# Patient Record
Sex: Female | Born: 1985 | Race: White | Hispanic: No | Marital: Single | Smoking: Current every day smoker
Health system: Southern US, Community
[De-identification: ages and names within clinical notes are randomized; demographics above are authoritative.]

## PROBLEM LIST (undated history)

## (undated) DIAGNOSIS — IMO0002 Reserved for concepts with insufficient information to code with codable children: Secondary | ICD-10-CM

## (undated) DIAGNOSIS — F1920 Other psychoactive substance dependence, uncomplicated: Secondary | ICD-10-CM

## (undated) DIAGNOSIS — K219 Gastro-esophageal reflux disease without esophagitis: Secondary | ICD-10-CM

## (undated) DIAGNOSIS — Z8489 Family history of other specified conditions: Secondary | ICD-10-CM

## (undated) DIAGNOSIS — R300 Dysuria: Secondary | ICD-10-CM

## (undated) DIAGNOSIS — F410 Panic disorder [episodic paroxysmal anxiety] without agoraphobia: Secondary | ICD-10-CM

## (undated) DIAGNOSIS — Z889 Allergy status to unspecified drugs, medicaments and biological substances status: Secondary | ICD-10-CM

## (undated) DIAGNOSIS — F32A Depression, unspecified: Secondary | ICD-10-CM

## (undated) DIAGNOSIS — N3000 Acute cystitis without hematuria: Secondary | ICD-10-CM

## (undated) DIAGNOSIS — S0291XA Unspecified fracture of skull, initial encounter for closed fracture: Secondary | ICD-10-CM

## (undated) DIAGNOSIS — T753XXA Motion sickness, initial encounter: Secondary | ICD-10-CM

## (undated) DIAGNOSIS — F329 Major depressive disorder, single episode, unspecified: Secondary | ICD-10-CM

## (undated) HISTORY — DX: Panic disorder (episodic paroxysmal anxiety): F41.0

## (undated) HISTORY — DX: Acute cystitis without hematuria: N30.00

## (undated) HISTORY — DX: Other psychoactive substance dependence, uncomplicated: F19.20

## (undated) HISTORY — DX: Dysuria: R30.0

## (undated) HISTORY — DX: Depression, unspecified: F32.A

## (undated) HISTORY — DX: Reserved for concepts with insufficient information to code with codable children: IMO0002

## (undated) HISTORY — DX: Unspecified fracture of skull, initial encounter for closed fracture: S02.91XA

## (undated) HISTORY — DX: Major depressive disorder, single episode, unspecified: F32.9

---
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
## 2012-02-11 ENCOUNTER — Encounter: Payer: Self-pay | Admitting: Internal Medicine

## 2012-02-17 ENCOUNTER — Encounter: Payer: Self-pay | Admitting: Internal Medicine

## 2012-02-19 ENCOUNTER — Other Ambulatory Visit: Payer: Self-pay | Admitting: Internal Medicine

## 2012-02-19 ENCOUNTER — Other Ambulatory Visit (INDEPENDENT_AMBULATORY_CARE_PROVIDER_SITE_OTHER): Payer: Self-pay

## 2012-02-19 ENCOUNTER — Encounter: Payer: Self-pay | Admitting: Internal Medicine

## 2012-02-19 ENCOUNTER — Ambulatory Visit (INDEPENDENT_AMBULATORY_CARE_PROVIDER_SITE_OTHER): Payer: Self-pay | Admitting: Internal Medicine

## 2012-02-19 VITALS — BP 92/70 | HR 64 | Ht 64.0 in | Wt 89.0 lb

## 2012-02-19 DIAGNOSIS — R110 Nausea: Secondary | ICD-10-CM

## 2012-02-19 DIAGNOSIS — R1030 Lower abdominal pain, unspecified: Secondary | ICD-10-CM

## 2012-02-19 DIAGNOSIS — R140 Abdominal distension (gaseous): Secondary | ICD-10-CM

## 2012-02-19 DIAGNOSIS — R932 Abnormal findings on diagnostic imaging of liver and biliary tract: Secondary | ICD-10-CM

## 2012-02-19 DIAGNOSIS — R142 Eructation: Secondary | ICD-10-CM

## 2012-02-19 DIAGNOSIS — R197 Diarrhea, unspecified: Secondary | ICD-10-CM

## 2012-02-19 DIAGNOSIS — R109 Unspecified abdominal pain: Secondary | ICD-10-CM

## 2012-02-19 DIAGNOSIS — R141 Gas pain: Secondary | ICD-10-CM

## 2012-02-19 LAB — HEPATIC FUNCTION PANEL
ALT: 15 U/L (ref 0–35)
AST: 21 U/L (ref 0–37)
Bilirubin, Direct: 0.1 mg/dL (ref 0.0–0.3)
Total Bilirubin: 0.4 mg/dL (ref 0.3–1.2)
Total Protein: 7.2 g/dL (ref 6.0–8.3)

## 2012-02-19 MED ORDER — HYOSCYAMINE SULFATE 0.125 MG SL SUBL: 0.1250 mg | SUBLINGUAL_TABLET | SUBLINGUAL | Status: DC | PRN

## 2012-02-19 MED ORDER — ALIGN PO CAPS: 1.0000 | ORAL_CAPSULE | Freq: Every day | ORAL | Status: DC

## 2012-02-19 MED ORDER — OMEPRAZOLE 40 MG PO CPDR: 40.0000 mg | DELAYED_RELEASE_CAPSULE | Freq: Every day | ORAL | Status: DC

## 2012-02-19 NOTE — Patient Instructions (Addendum)
Your physician has requested that you go to the basement for lab work before leaving today.  We have sent the following medications to your pharmacy for you to pick up at your convenience:Levsin; take as directed Omeprazole; take 30 min before your first meal of the day. Align daily  You have been scheduled for an Abdominal U/S at [facility] on 02/24/2012 @ 8:30am please arrive at 8:15am to Radiology (1st Floor) Have nothing to eat or Drink After midnight prior to your Scan.  If you cannot make this appointment please call [phone] to reschedule  Follow up With Dr. Rhea Belton in 6 weeks

## 2012-02-19 NOTE — Progress Notes (Signed)
02/19/2012 Meredith Perez [id number] 05-Nov-1985   HISTORY OF PRESENT ILLNESS:  Patient presents to our office today with several complaints including nausea, lower abdominal pain, bloating, and diarrhea.  She says that the nausea has been present for years, but has been getting worse recently.  She also has heartburn/indigestion on and off as well.  Has phenergan, which she takes as needed with some relief.  Has taken omeprazole for short courses in the past as well, which has seemed to help also.    The abdominal pain has been present for a couple of months and it was initially thought to be due to a UTI or GYN related.  It is described as a sharp pain at times, but less severe constantly.  Feels bloated and thinks it is causing pressure into her back with low back pain.  A CT of the abdomen and pelvis was performed when she went to the ED because of the pain.  This showed some periportal hepatic edema and a left ovarian corpus luteum cyst, but was otherwise unremarkable.  LFT's ,CBC, and BMP were all WNL's.  Says that she does have burning and pain when she urinates.  Diarrhea developed with the last month and a half.  Goes 4-5 times a day without blood.  Was black but she was taking pepto-bismol several times a day.  Wakes up sometimes once a night with need to have a BM.  Abdominal pain does not get better after BM.  No recent travel, sick contacts, etc.  Stools studies were not performed, but she was placed on flagyl empirically.  Says that she has lost some weight, maybe 5 pounds or so.   Not sure how good her diet is; she seems to drink a lot of coffee and soda.    Past Medical History  Diagnosis Date  . Depression   . HGSIL (high grade squamous intraepithelial dysplasia)   . Panic disorder   . Skull fracture   . Substance dependence   . Acute cystitis   . Dysuria    History reviewed. No pertinent past surgical history.  reports that she has been smoking Cigarettes.  She has been  smoking about .5 packs per day. She has never used smokeless tobacco. She reports that she does not drink alcohol or use illicit drugs. family history includes Breast cancer in her paternal grandmother; Colon cancer in an unspecified family member; and Heart disease in her maternal grandfather. Allergies  Allergen Reactions  . Sulfa Antibiotics Rash      Outpatient Encounter Prescriptions as of 02/19/2012  Medication Sig Dispense Refill  . metroNIDAZOLE (FLAGYL) 500 MG tablet Take 500 mg by mouth 3 (three) times daily.      . bifidobacterium infantis (ALIGN) capsule Take 1 capsule by mouth daily.  14 capsule  0  . hyoscyamine (LEVSIN SL) 0.125 MG SL tablet Place 1 tablet (0.125 mg total) under the tongue every 4 (four) hours as needed for cramping.  30 tablet  0  . omeprazole (PRILOSEC) 40 MG capsule Take 1 capsule (40 mg total) by mouth daily.  90 capsule  3  . [DISCONTINUED] imiquimod (ALDARA) 5 % cream Apply topically 3 (three) times a week.         REVIEW OF SYSTEMS  : All other systems reviewed and negative except where noted in the History of Present Illness.   PHYSICAL EXAM: BP 92/70  Pulse 64  Ht 5\' 4"  (1.626 m)  Wt 89 lb (40.37 kg)  BMI 15.28 kg/m2  LMP 02/10/2012 General: Thin white female in no acute distress Head: Normocephalic and atraumatic Eyes:  sclerae anicteric,conjunctive pink. Ears: Normal auditory acuity. Lungs: Clear throughout to auscultation Heart: Regular rate and rhythm Abdomen: Soft, non-distended. No masses or hepatomegaly noted. Normal bowel sounds.  Mild diffuse TTP without R/R/G. Musculoskeletal: Symmetrical with no gross deformities  Skin: No lesions on visible extremities Extremities: No edema  Neurological: Alert oriented x 4, grossly nonfocal Psychological:  Alert and cooperative. Normal mood and affect  ASSESSMENT AND PLAN: -CT of the abdomen showing periportal hepatic edema:  Had normal LFT's.  Likely secondary to IVF's that she was given  just prior to the CT scan.  Will repeat LFT's and check ultrasound of the liver and gallbladder. -Nausea and heartburn/indigestion:  Will start omeprazole 40 mg daily.  Continue phenergan as needed. 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  Will start daily probiotic, Align samples given.  Check TSH and celiac panel.  Check stool studies as well:  Cdiff, stool culture, WBC's, and O&P. -Lower abdominal pain and bloating:  Likely related to the diarrhea.  Will try levsin.  Check a urinalysis with culture as well since she is still complaining of urinary symptoms.  Low gas diet as well.  *Patient will follow-up in 6 weeks.  If still having diarrhea and no improvement, then would consider flex sig.  If nausea not improved, then possible EGD.  Addendum: The above note was completed by Doug Sou, PA-C.  I participated in the patient evaluation, formulation of assessment and plan. Reviewed and agree with initial management. Beverley Fiedler, MD

## 2012-02-20 LAB — FECAL LACTOFERRIN, QUANT: Lactoferrin: NEGATIVE

## 2012-02-23 LAB — OVA AND PARASITE EXAMINATION

## 2012-02-24 ENCOUNTER — Ambulatory Visit (HOSPITAL_COMMUNITY)
Admission: RE | Admit: 2012-02-24 | Discharge: 2012-02-24 | Disposition: A | Discharge status: Home / Self Care | Payer: Medicaid Other | Source: Ambulatory Visit | Attending: Internal Medicine | Admitting: Internal Medicine

## 2012-02-24 DIAGNOSIS — R140 Abdominal distension (gaseous): Secondary | ICD-10-CM

## 2012-02-24 DIAGNOSIS — R1030 Lower abdominal pain, unspecified: Secondary | ICD-10-CM

## 2012-02-24 DIAGNOSIS — R110 Nausea: Secondary | ICD-10-CM

## 2012-02-24 DIAGNOSIS — R109 Unspecified abdominal pain: Secondary | ICD-10-CM | POA: Insufficient documentation

## 2012-02-25 ENCOUNTER — Ambulatory Visit: Payer: Self-pay | Admitting: Gastroenterology

## 2012-02-26 ENCOUNTER — Other Ambulatory Visit: Payer: Self-pay | Admitting: *Deleted

## 2012-02-26 DIAGNOSIS — R300 Dysuria: Secondary | ICD-10-CM

## 2013-01-12 ENCOUNTER — Other Ambulatory Visit: Payer: Self-pay

## 2014-03-10 IMAGING — US US ABDOMEN COMPLETE
1 series · 14 of 25 positions shown · non-contrast
Comparison: None.

CLINICAL DATA: Abdominal pain

COMPLETE ABDOMINAL ULTRASOUND

[Series 1: us abdomen complete · 0.20mm/px · 14 of 79 slices shown]
[im 1/79]
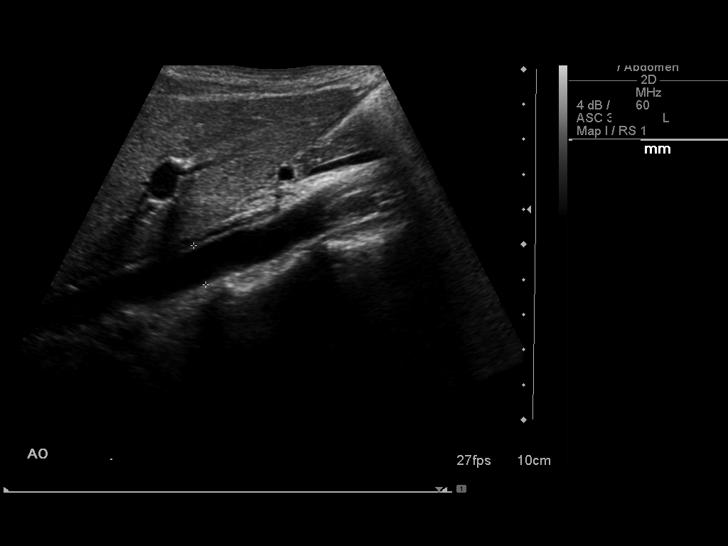
[im 7/79]
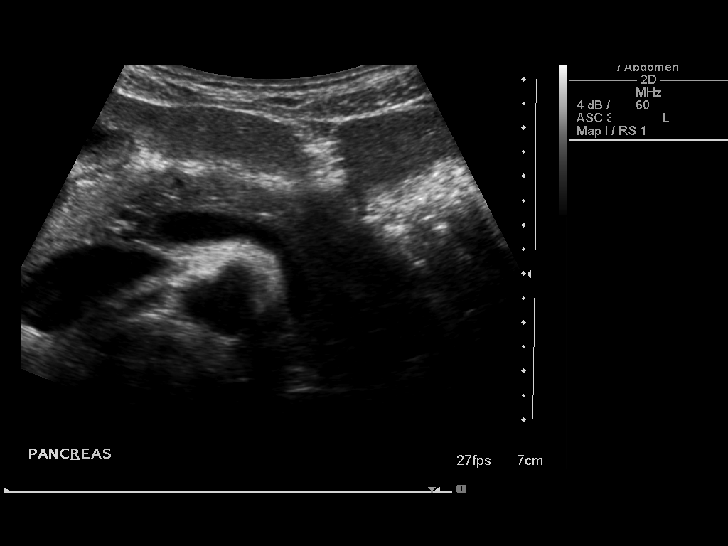
[im 14/79]
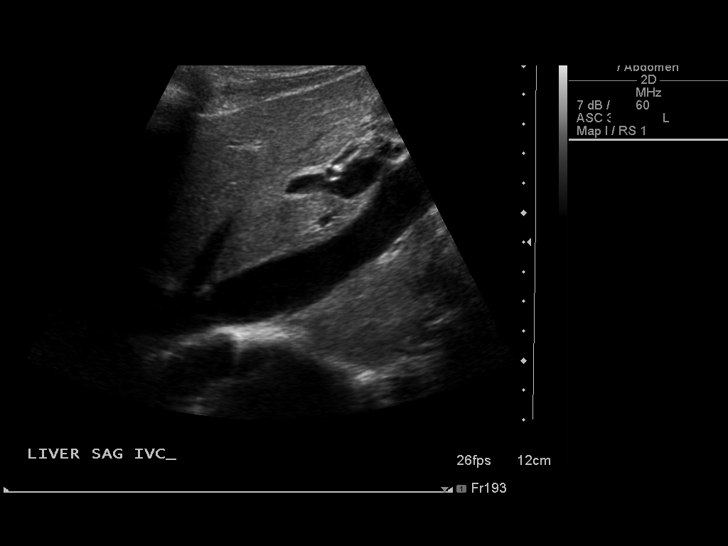
[im 20/79]
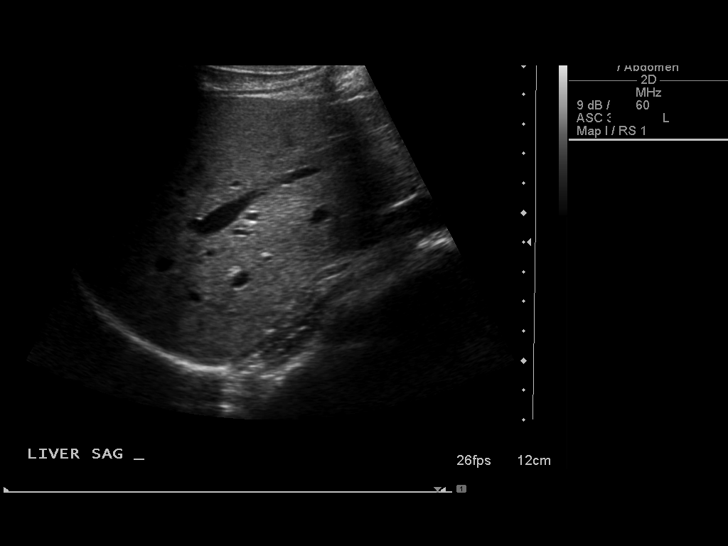
[im 27/79]
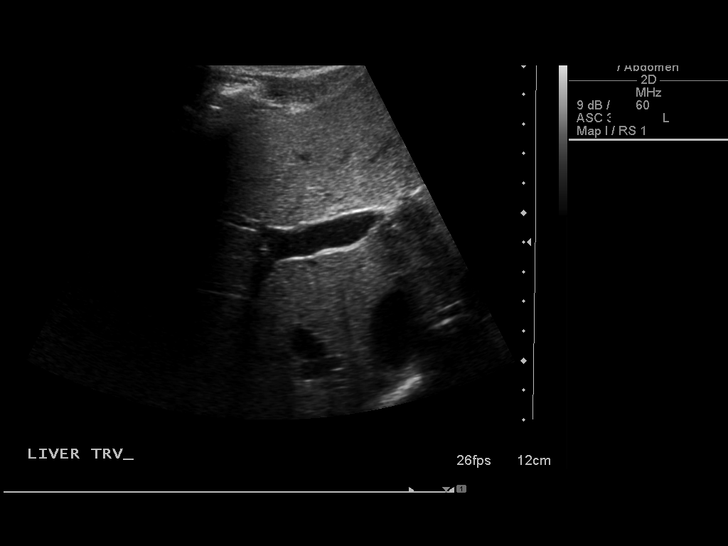
[im 30/79]
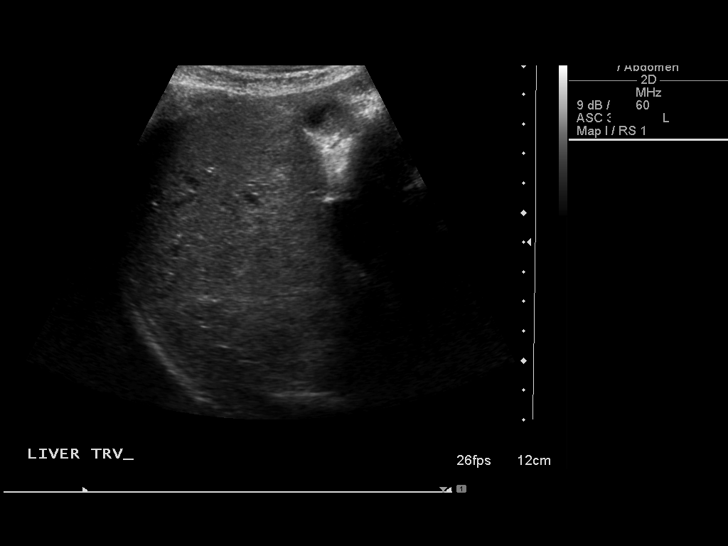
[im 36/79]
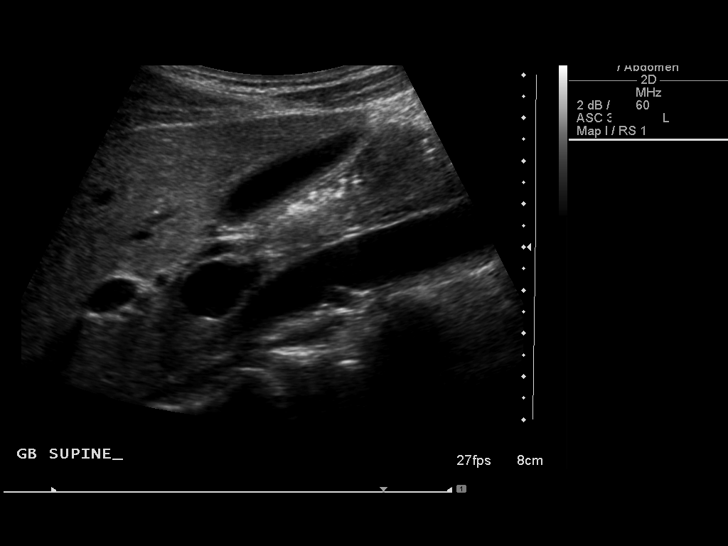
[im 43/79]
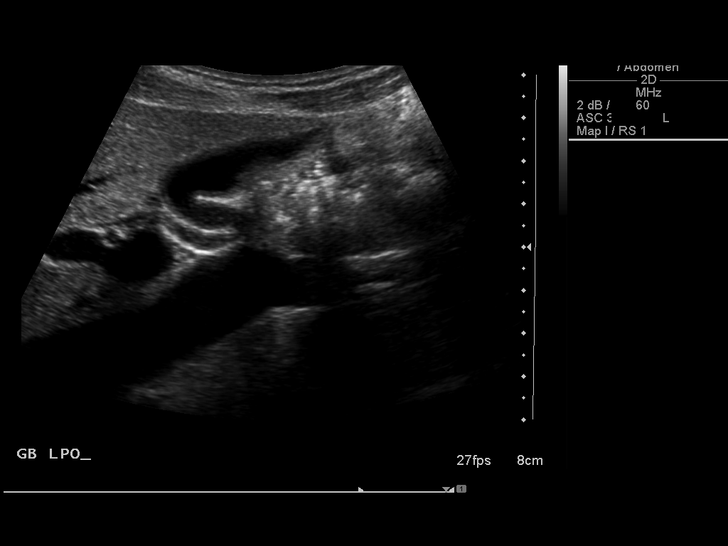
[im 49/79]
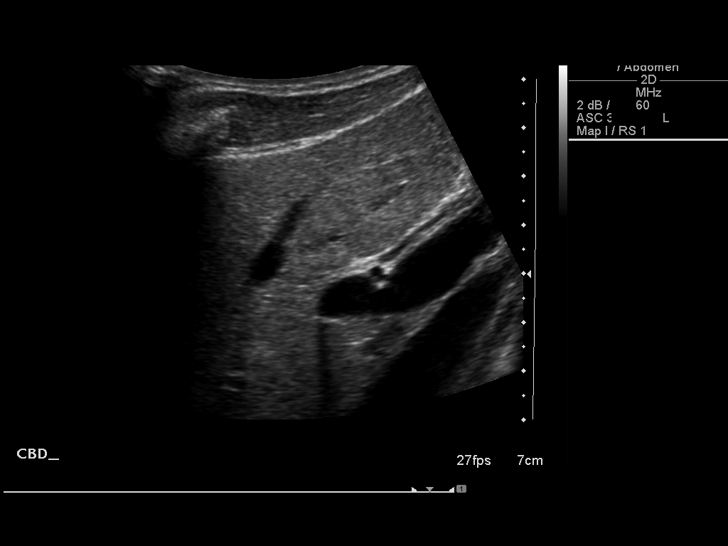
[im 53/79]
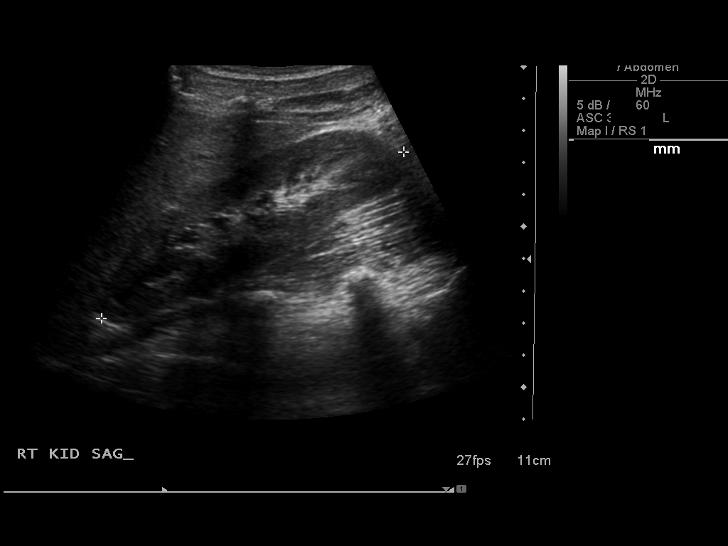
[im 59/79]
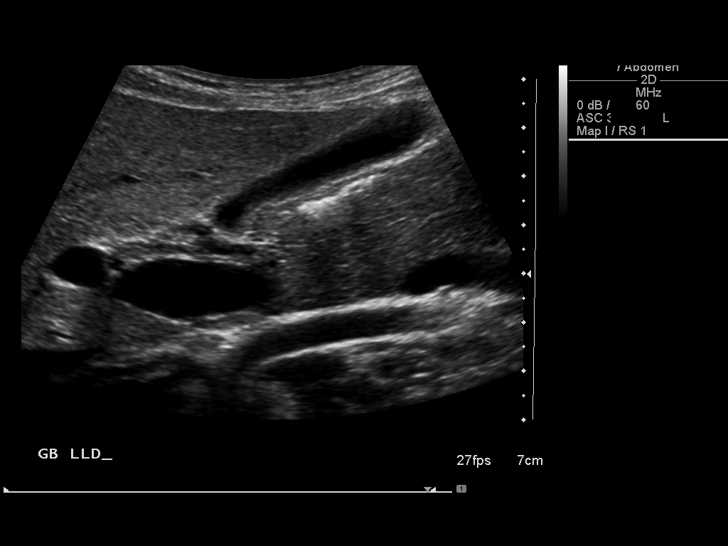
[im 66/79]
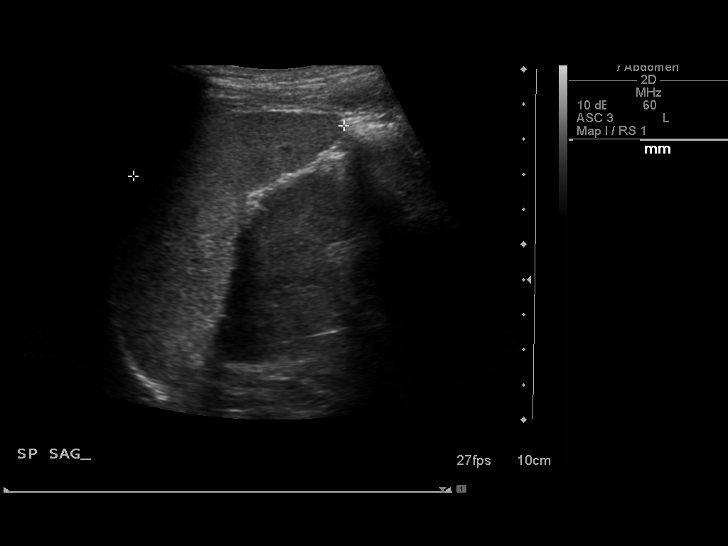
[im 72/79]
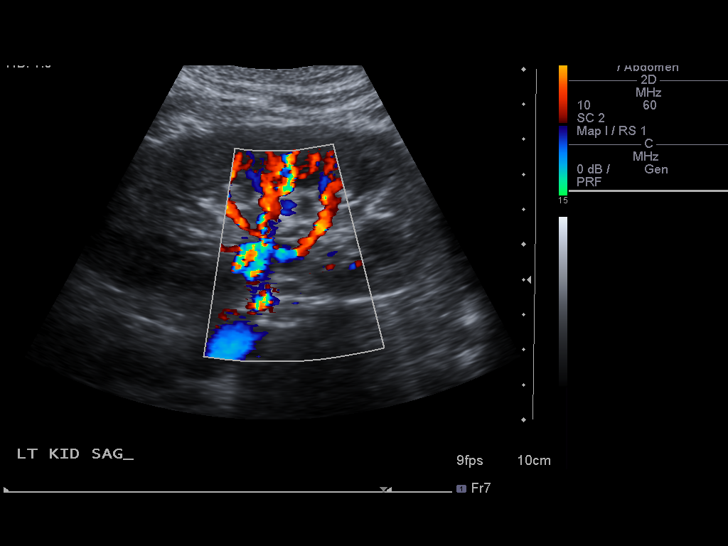
[im 79/79]
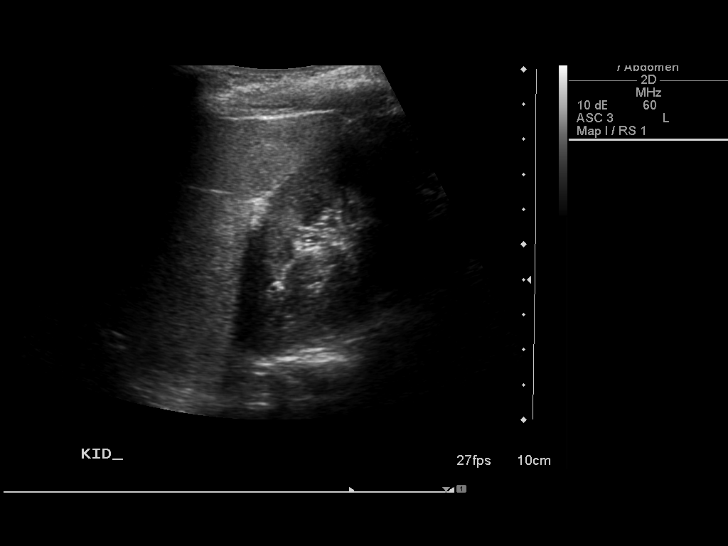

[14 of 25 positions shown; findings below may reference images not displayed]

FINDINGS: Gallbladder:  No gallstones, gallbladder wall thickening, or
pericholecystic fluid. No sonographic Murphy's sign.

Common bile duct:  Measures 3.2 mm in diameter within normal
limits.

Liver:  No focal lesion identified.  Within normal limits in
parenchymal echogenicity.

IVC:  Appears normal.

Pancreas:  No focal abnormality seen.

Spleen:  Measures 6.2 cm in length.  Normal echogenicity.

Right Kidney:  Measures 10.8 cm in length.  No mass, hydronephrosis
or diagnostic renal calculus

Left Kidney:  Measures 10.6 cm in length.  No mass, hydronephrosis
or diagnostic renal calculus

Abdominal aorta:  No aneurysm identified. Measures up to 1.2 cm in
diameter.
IMPRESSION: Negative abdominal ultrasound.

## 2015-07-01 ENCOUNTER — Encounter: Payer: Self-pay | Admitting: Gynecologic Oncology

## 2015-07-01 ENCOUNTER — Ambulatory Visit: Payer: Medicaid Other | Attending: Gynecologic Oncology | Admitting: Gynecologic Oncology

## 2015-07-01 VITALS — BP 113/53 | HR 73 | Temp 97.6°F | Resp 18 | Ht 64.0 in | Wt 83.7 lb

## 2015-07-01 DIAGNOSIS — R971 Elevated cancer antigen 125 [CA 125]: Secondary | ICD-10-CM | POA: Diagnosis not present

## 2015-07-01 DIAGNOSIS — F17210 Nicotine dependence, cigarettes, uncomplicated: Secondary | ICD-10-CM | POA: Diagnosis not present

## 2015-07-01 DIAGNOSIS — F1310 Sedative, hypnotic or anxiolytic abuse, uncomplicated: Secondary | ICD-10-CM | POA: Diagnosis not present

## 2015-07-01 DIAGNOSIS — N83201 Unspecified ovarian cyst, right side: Secondary | ICD-10-CM | POA: Insufficient documentation

## 2015-07-01 DIAGNOSIS — F419 Anxiety disorder, unspecified: Secondary | ICD-10-CM

## 2015-07-01 DIAGNOSIS — E43 Unspecified severe protein-calorie malnutrition: Secondary | ICD-10-CM | POA: Diagnosis not present

## 2015-07-01 DIAGNOSIS — R110 Nausea: Secondary | ICD-10-CM | POA: Insufficient documentation

## 2015-07-01 DIAGNOSIS — F329 Major depressive disorder, single episode, unspecified: Secondary | ICD-10-CM

## 2015-07-01 DIAGNOSIS — N83291 Other ovarian cyst, right side: Secondary | ICD-10-CM | POA: Diagnosis not present

## 2015-07-01 DIAGNOSIS — R300 Dysuria: Secondary | ICD-10-CM | POA: Diagnosis not present

## 2015-07-01 DIAGNOSIS — R197 Diarrhea, unspecified: Secondary | ICD-10-CM | POA: Diagnosis not present

## 2015-07-01 DIAGNOSIS — F418 Other specified anxiety disorders: Secondary | ICD-10-CM

## 2015-07-01 DIAGNOSIS — E46 Unspecified protein-calorie malnutrition: Secondary | ICD-10-CM | POA: Diagnosis not present

## 2015-07-01 DIAGNOSIS — N83299 Other ovarian cyst, unspecified side: Secondary | ICD-10-CM

## 2015-07-01 DIAGNOSIS — R102 Pelvic and perineal pain: Secondary | ICD-10-CM | POA: Insufficient documentation

## 2015-07-01 DIAGNOSIS — R112 Nausea with vomiting, unspecified: Secondary | ICD-10-CM | POA: Insufficient documentation

## 2015-07-01 DIAGNOSIS — G8929 Other chronic pain: Secondary | ICD-10-CM | POA: Diagnosis not present

## 2015-07-01 DIAGNOSIS — M25559 Pain in unspecified hip: Secondary | ICD-10-CM | POA: Insufficient documentation

## 2015-07-01 DIAGNOSIS — N3000 Acute cystitis without hematuria: Secondary | ICD-10-CM | POA: Diagnosis not present

## 2015-07-01 DIAGNOSIS — F1121 Opioid dependence, in remission: Secondary | ICD-10-CM | POA: Insufficient documentation

## 2015-07-01 MED ORDER — LORAZEPAM 0.5 MG PO TABS: 0.5000 mg | ORAL_TABLET | Freq: Every day | ORAL | Status: DC

## 2015-07-01 NOTE — Patient Instructions (Signed)
Preparing for your Surgery  Plan for surgery on May 9 with Dr. Laurette Schimke at [facility].  You will be scheduled for a robotic assisted right salpingo-oophorectomy, possible hysterectomy, possible bilateral salpingo-oophorectomy, possible staging.  Pre-operative Testing -You will receive a phone call from presurgical testing at [facility] to arrange for a pre-operative testing appointment before your surgery.  This appointment normally occurs one to two weeks before your scheduled surgery.   -Bring your insurance card, copy of an advanced directive if applicable, medication list  -At that visit, you will be asked to sign a consent for a possible blood transfusion in case a transfusion becomes necessary during surgery.  The need for a blood transfusion is rare but having consent is a necessary part of your care.     -You should not be taking blood thinners or aspirin at least ten days prior to surgery unless instructed by your surgeon.  Day Before Surgery at Home -You will be asked to take in a light diet the day before surgery.  Avoid carbonated beverages.  You will be advised to have nothing to eat or drink after midnight the evening before.     Eat a light diet the day before surgery.  Examples including soups, broths, toast, yogurt, mashed potatoes.  Things to avoid include carbonated beverages (fizzy beverages), raw fruits and raw vegetables, or beans.    If your bowels are filled with gas, your surgeon will have difficulty visualizing your pelvic organs which increases your surgical risks.  Your role in recovery Your role is to become active as soon as directed by your doctor, while still giving yourself time to heal.  Rest when you feel tired. You will be asked to do the following in order to speed your recovery:  - Cough and breathe deeply. This helps toclear and expand your lungs and can prevent pneumonia. You may be given a spirometer to practice  deep breathing. A staff member will show you how to use the spirometer. - Do mild physical activity. Walking or moving your legs help your circulation and body functions return to normal. A staff member will help you when you try to walk and will provide you with simple exercises. Do not try to get up or walk alone the first time. - Actively manage your pain. Managing your pain lets you move in comfort. We will ask you to rate your pain on a scale of zero to 10. It is your responsibility to tell your doctor or nurse where and how much you hurt so your pain can be treated.  Special Considerations -If you are diabetic, you may be placed on insulin after surgery to have closer control over your blood sugars to promote healing and recovery.  This does not mean that you will be discharged on insulin.  If applicable, your oral antidiabetics will be resumed when you are tolerating a solid diet.  -Your final pathology results from surgery should be available by the Friday after surgery and the results will be relayed to you when available.  Blood Transfusion Information WHAT IS A BLOOD TRANSFUSION? A transfusion is the replacement of blood or some of its parts. Blood is made up of multiple cells which provide different functions.  Red blood cells carry oxygen and are used for blood loss replacement.  White blood cells fight against infection.  Platelets control bleeding.  Plasma helps clot blood.  Other blood products are available for specialized needs, such as hemophilia or  other clotting disorders. BEFORE THE TRANSFUSION  Who gives blood for transfusions?   You may be able to donate blood to be used at a later date on yourself (autologous donation).  Relatives can be asked to donate blood. This is generally not any safer than if you have received blood from a stranger. The same precautions are taken to ensure safety when a relative's blood is donated.  Healthy volunteers who are fully  evaluated to make sure their blood is safe. This is blood bank blood. Transfusion therapy is the safest it has ever been in the practice of medicine. Before blood is taken from a donor, a complete history is taken to make sure that person has no history of diseases nor engages in risky social behavior (examples are intravenous drug use or sexual activity with multiple partners). The donor's travel history is screened to minimize risk of transmitting infections, such as malaria. The donated blood is tested for signs of infectious diseases, such as HIV and hepatitis. The blood is then tested to be sure it is compatible with you in order to minimize the chance of a transfusion reaction. If you or a relative donates blood, this is often done in anticipation of surgery and is not appropriate for emergency situations. It takes many days to process the donated blood. RISKS AND COMPLICATIONS Although transfusion therapy is very safe and saves many lives, the main dangers of transfusion include:   Getting an infectious disease.  Developing a transfusion reaction. This is an allergic reaction to something in the blood you were given. Every precaution is taken to prevent this. The decision to have a blood transfusion has been considered carefully by your caregiver before blood is given. Blood is not given unless the benefits outweigh the risks.

## 2015-07-01 NOTE — Progress Notes (Signed)
Consult Note: Gyn-Onc  Consult was requested by Dr. Donavan FoilBass for the evaluation of Meredith Perez 30 y.o. female  CC:  Chief Complaint  Patient presents with  . Complex ovarian cyst    New Consultation    Assessment/Plan:  Ms. Meredith Perez  is a 30 y.o.  year old with a 6cm right ovarian thick walled complex cyst and elevated CA125 and chronic pelvic pain in the setting of severe malnutrition and a history of substance abuse.  I discussed with Meredith Perez that overall my suspicion for malignancy is low. I have personally reviewed the images from her CT scan from [place] on 06/11/15. She has a thickwalled right ovarian cyst with no ascites/peritoneal carcinomatosis/lymphadenopathy. The cyst is 6cm in size.  However, given its complex appearance, and elevation in tumor marker, and given her symptoms of pelvic pain, I recommend surgical removal (robotic assisted right salpingo-oophorectomy) and frozen section pathology. If malignancy is identified, staging would be performed BUT THIS WOULD BE FERTILITY PRESERVING (NO HYSTERECTOMY/LSO) UNLESS THESE STRUCTURES WERE GROSSLY INVOLVED WITH MALIGNANCY, in which case, she consents to hysterectomy/bso as part of debulking. Based on my review of her imaging and my exam, I do not believe hysterectomy/LSO will be necessary in order to stage a cancer if one is found.   I discussed the possibility of chronic persistent pain postop as she has had pain for 7 years, and it is unlikely to be exclusively a function of this ovarian mass. 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 She has requested ativan to help with her anxiety and pain leading up to the surgery. I prescribed 20 tabs of 0.5mg  ativan leading up until surgery. We discussed that I would not provide additional refills for this and that we will only be able to  prescribe analgesic narcotic meds for 6 weeks postoperatively, and after that time we will not be able to prescribe additional narcotics if requested.  She has unexplained severe protein wasting malnutrition (albumin 3.3 and total protein 6.0). She has symptoms of nausea and diarrhea. She has not had a GI workup. I have concerns about a protein wasting enteropathy. I discussed that I doubt that surgery will resolve her GI symptoms as they are unlikely to be related to her cyst. I have discussed risk of wound non-healing and the importance of protein consumption in the form of nutritional supplements and protein containing meals preop.  I discussed operative risks including  bleeding, infection, damage to internal organs (such as bladder,ureters, bowels), blood clot, reoperation and rehospitalization. I discussed that if her ovarian mass is adherent to internal structures this increases that risk. I discussed that her malnutrition and smoking history place her at increased risk for these complications.   In order to expedite her surgical date given her pain symptoms, she has requested surgery to be performed by my partner, Dr Laurette SchimkeWendy Brewster on May, 9th.  HPI: Meredith Perez is a 30 year old G1P1 who is seen in consultation at the request of Dr Donavan FoilBass for a 6cm right ovarian mass and elevated CA 125.   The patient reports having and an approximate 7 year history of intermittent vague pelvic pain since 2010 after the delivery of her son. Prior to that she had an intrauterine device that was removed in 2009 due to symptoms of irregular spotting. But following its removal her irregular  bleeding and pain became worse. The pain persisted for many years but has become increasingly worse since the spring of 2017 at which time the pelvic pain became more constant and concentrated in the midline pelvis. She presented to [facility] ER on 06/11/2015. CT scan of the abdomen and pelvis with IV contrast was performed.  This demonstrated an anteverted uterus with a cyst arising in the right adnexa measuring 5.2 x 4.2 x 2.9 cm. The left ovary contained a 1.7 cm clot cyst. There is minimal free fluid in the cul-de-sac. There were no appreciable periappendiceal inflammation. There was no adenopathy, ascites, or peritoneal carcinomatosis identified.  The patient was then evaluated by Dr. Mariel Aloe in his office with a pelvic ultrasound scan on 06/14/15 which showed a 3.35x 2.4x 3.3cm, with papillary projections noted.   CA 125 was drawn on 06/14/15 and was elevated at 235. CEA and CA 19-9 were normal. WBC was normal. LFTs had been mildly elevated in ER (she had binged on ETOH the night before) but were normalized on 06/14/15. Albumin was 3.3 and total protein was 6.  The patient has many years of diarrhea and nausea. She has great difficulty gaining weight. She has ADD but is not on stimulant medication for this and denies non-prescription stimulant abuse (other than nicotine).  The patient has a remote (7 years ago) history of valium abuse.   Current Meds:  Outpatient Encounter Prescriptions as of 07/01/2015  Medication Sig  . buPROPion (WELLBUTRIN XL) 150 MG 24 hr tablet TAKE 1 TABLET (150 MG TOTAL) BY MOUTH EVERY MORNING. DEPRESSION, ATTENTION  . mirtazapine (REMERON) 30 MG tablet TAKE 1 TABLET (30 MG TOTAL) BY MOUTH NIGHTLY. DEPRESSION AND APPETITE  . ondansetron (ZOFRAN-ODT) 4 MG disintegrating tablet TAKE 1 TABLET ON THE TONGUE EVERY 6 HOURS AS NEEDED FOR NAUSEA/VOMITING  . [DISCONTINUED] hyoscyamine (LEVSIN SL) 0.125 MG SL tablet Place 1 tablet (0.125 mg total) under the tongue every 4 (four) hours as needed for cramping.  Marland Kitchen LORazepam (ATIVAN) 0.5 MG tablet Take 1 tablet (0.5 mg total) by mouth at bedtime.  . [DISCONTINUED] bifidobacterium infantis (ALIGN) capsule Take 1 capsule by mouth daily.  . [DISCONTINUED] metroNIDAZOLE (FLAGYL) 500 MG tablet Take 500 mg by mouth 3 (three) times daily.  . [DISCONTINUED]  omeprazole (PRILOSEC) 40 MG capsule Take 1 capsule (40 mg total) by mouth daily.   No facility-administered encounter medications on file as of 07/01/2015.    Allergy:  Allergies  Allergen Reactions  . Sulfa Antibiotics Rash    Social Hx:   Social History   Social History  . Marital Status: Single    Spouse Name: N/A  . Number of Children: 1  . Years of Education: N/A   Occupational History  . Unemployed    Social History Main Topics  . Smoking status: Current Every Day Smoker -- 0.75 packs/day    Types: Cigarettes  . Smokeless tobacco: Never Used  . Alcohol Use: No  . Drug Use: No  . Sexual Activity: Not on file   Other Topics Concern  . Not on file   Social History Narrative    Past Surgical Hx: History reviewed. No pertinent past surgical history.  Past Medical Hx:  Past Medical History  Diagnosis Date  . Depression   . HGSIL (high grade squamous intraepithelial dysplasia)   . Panic disorder   . Skull fracture (HCC)   . Substance dependence (HCC)   . Acute cystitis   . Dysuria  Past Gynecological History:  SVD x 1  No LMP recorded.  Family Hx:  Family History  Problem Relation Age of Onset  . Breast cancer Paternal Grandmother   . Colon cancer    . Heart disease Maternal Grandfather     Review of Systems:  Constitutional  Feels generally unwell  ENT Normal appearing ears and nares bilaterally Skin/Breast  No rash, sores, jaundice, itching, dryness Cardiovascular  No chest pain, shortness of breath, or edema  Pulmonary  No cough or wheeze.  Gastro Intestinal  + nausea, vomitting, + diarrhoea. No bright red blood per rectum, + abdominal pain, Genito Urinary  No frequency, urgency, dysuria, no bleeding Musculo Skeletal  No myalgia, arthralgia, joint swelling or pain  Neurologic  No weakness, numbness, change in gait,  Psychology  No depression, anxiety, insomnia.    Vitals:  Blood pressure 113/53, pulse 73, temperature 97.6 F  (36.4 C), temperature source Oral, resp. rate 18, height  (1.626 m), weight 83 lb 11.2 oz (37.966 kg), SpO2 100 %.  Physical Exam: WD in NAD Neck  Supple NROM, without any enlargements.  Lymph Node Survey No cervical supraclavicular, + shotty bilateral inguinal adenopathy Cardiovascular  Pulse normal rate, regularity and rhythm. S1 and S2 normal.  Lungs  Clear to auscultation bilateraly, without wheezes/crackles/rhonchi. Good air movement.  Skin  No rash/lesions/breakdown  Psychiatry  Alert and oriented to person, place, and time  Abdomen  Normoactive bowel sounds, abdomen soft, non-tender and extremely thin without evidence of hernia.  Back No CVA tenderness Genito Urinary  Vulva/vagina: Normal external female genitalia.  No lesions. No discharge or bleeding.  Bladder/urethra:  No lesions or masses, well supported bladder  Vagina: normal  Cervix: Normal appearing, no lesions.  Uterus: Small, mobile, no parametrial involvement or nodularity.  Adnexa: fullness in the right adnexa, tender to palpate ++ Rectal  Good tone, no masses no cul de sac nodularity.  Extremities  No bilateral cyanosis, clubbing or edema.   Quinn Axe, MD  07/01/2015, 12:35 PM

## 2015-07-09 NOTE — Patient Instructions (Addendum)
Meredith Perez  07/09/2015   Your procedure is scheduled on: 07-16-15   [facility]  [facility] to 3rd floor to  Short Stay Center at 0700  AM.  Call this number if you have problems the morning of surgery [phone]   Remember: ONLY 1 PERSON MAY GO WITH YOU TO SHORT STAY TO GET  READY MORNING OF YOUR SURGERY.  Do not eat food or drink liquids :After Midnight.    Follow office instructions : eat a light diet the day before surgery, avoid carbonated beverages,raw fruits and raw vegetables, or beans..   Take these medicines the morning of surgery with A SIP OF WATER: Bupropion. Tylenol, Claritin -if need.  DO NOT TAKE ANY DIABETIC MEDICATIONS DAY OF YOUR SURGERY                               You may not have any metal on your body including hair pins and              piercings  Do not wear jewelry, make-up, lotions, powders or perfumes, deodorant             Do not wear nail polish.  Do not shave  48 hours prior to surgery.              Men may shave face and neck.   Do not bring valuables to the hospital. [facility] IS NOT             RESPONSIBLE   FOR VALUABLES.  Contacts, dentures or bridgework may not be worn into surgery.  Leave suitcase in the car. After surgery it may be brought to your room.     Patients discharged the day of surgery will not be allowed to drive home.  Name and phone number of your driver: Meredith OrleansMelanie Perez-mother [phone] cell  Special Instructions: N/A              Please read over the following fact sheets you were given: _____________________________________________________________________             [facility] - Pre[facility] Before surgery, you can play an important role.  Because skin is not sterile, your skin needs to be as free of germs as possible.  You can reduce the number of germs on your skin by washing with CHG (chlorahexidine gluconate) soap before surgery.  CHG is an  antiseptic cleaner which kills germs and bonds with the skin to continue killing germs even after washing. Please DO NOT use if you have an allergy to CHG or antibacterial soaps.  If your skin becomes reddened/irritated stop using the CHG and inform your nurse when you arrive at Short Stay. Do not shave (including legs and underarms) for at least 48 hours prior to the first CHG shower.  You may shave your face/neck. Please follow these instructions carefully:  1.  Shower with CHG Soap the night before surgery and the  morning of Surgery.  2.  If you choose to wash your hair, wash your hair first as usual with your  normal  shampoo.  3.  After you shampoo, rinse your hair and body thoroughly to remove the  shampoo.  4.  Use CHG as you would any other liquid soap.  You can apply chg directly  to the skin and wash                       Gently with a scrungie or clean washcloth.  5.  Apply the CHG Soap to your body ONLY FROM THE NECK DOWN.   Do not use on face/ open                           Wound or open sores. Avoid contact with eyes, ears mouth and genitals (private parts).                       Wash face,  Genitals (private parts) with your normal soap.             6.  Wash thoroughly, paying special attention to the area where your surgery  will be performed.  7.  Thoroughly rinse your body with warm water from the neck down.  8.  DO NOT shower/wash with your normal soap after using and rinsing off  the CHG Soap.                9.  Pat yourself dry with a clean towel.            10.  Wear clean pajamas.            11.  Place clean sheets on your bed the night of your first shower and do not  sleep with pets. Day of Surgery : Do not apply any lotions/deodorants the morning of surgery.  Please wear clean clothes to the hospital/surgery center.  FAILURE TO FOLLOW THESE INSTRUCTIONS MAY RESULT IN THE CANCELLATION OF YOUR SURGERY PATIENT  SIGNATURE_________________________________  NURSE SIGNATURE__________________________________  ________________________________________________________________________   Adam Phenix  An incentive spirometer is a tool that can help keep your lungs clear and active. This tool measures how well you are filling your lungs with each breath. Taking long deep breaths may help reverse or decrease the chance of developing breathing (pulmonary) problems (especially infection) following:  A long period of time when you are unable to move or be active. BEFORE THE PROCEDURE   If the spirometer includes an indicator to show your best effort, your nurse or respiratory therapist will set it to a desired goal.  If possible, sit up straight or lean slightly forward. Try not to slouch.  Hold the incentive spirometer in an upright position. INSTRUCTIONS FOR USE   Sit on the edge of your bed if possible, or sit up as far as you can in bed or on a chair.  Hold the incentive spirometer in an upright position.  Breathe out normally.  Place the mouthpiece in your mouth and seal your lips tightly around it.  Breathe in slowly and as deeply as possible, raising the piston or the ball toward the top of the column.  Hold your breath for 3-5 seconds or for as long as possible. Allow the piston or ball to fall to the bottom of the column.  Remove the mouthpiece from your mouth and breathe out normally.  Rest for a few seconds and repeat Steps 1 through 7 at least 10 times every 1-2 hours when you are awake. Take your time and take a few normal breaths between deep breaths.  The spirometer may include an indicator to show  your best effort. Use the indicator as a goal to work toward during each repetition.  After each set of 10 deep breaths, practice coughing to be sure your lungs are clear. If you have an incision (the cut made at the time of surgery), support your incision when coughing by placing a  pillow or rolled up towels firmly against it. Once you are able to get out of bed, walk around indoors and cough well. You may stop using the incentive spirometer when instructed by your caregiver.  RISKS AND COMPLICATIONS  Take your time so you do not get dizzy or light-headed.  If you are in pain, you may need to take or ask for pain medication before doing incentive spirometry. It is harder to take a deep breath if you are having pain. AFTER USE  Rest and breathe slowly and easily.  It can be helpful to keep track of a log of your progress. Your caregiver can provide you with a simple table to help with this. If you are using the spirometer at home, follow these instructions: [place] IF:   You are having difficultly using the spirometer.  You have trouble using the spirometer as often as instructed.  Your pain medication is not giving enough relief while using the spirometer.  You develop fever of 100.5 F (38.1 C) or higher. SEEK IMMEDIATE MEDICAL CARE IF:   You cough up bloody sputum that had not been present before.  You develop fever of 102 F (38.9 C) or greater.  You develop worsening pain at or near the incision site. MAKE SURE YOU:   Understand these instructions.  Will watch your condition.  Will get help right away if you are not doing well or get worse. Document Released: 07/06/2006 Document Revised: 05/18/2011 Document Reviewed: 09/06/2006 ExitCare Patient Information 2014 ExitCare, [place].   ________________________________________________________________________  WHAT IS A BLOOD TRANSFUSION? Blood Transfusion Information  A transfusion is the replacement of blood or some of its parts. Blood is made up of multiple cells which provide different functions.  Red blood cells carry oxygen and are used for blood loss replacement.  White blood cells fight against infection.  Platelets control bleeding.  Plasma helps clot blood.  Other blood  products are available for specialized needs, such as hemophilia or other clotting disorders. BEFORE THE TRANSFUSION  Who gives blood for transfusions?   Healthy volunteers who are fully evaluated to make sure their blood is safe. This is blood bank blood. Transfusion therapy is the safest it has ever been in the practice of medicine. Before blood is taken from a donor, a complete history is taken to make sure that person has no history of diseases nor engages in risky social behavior (examples are intravenous drug use or sexual activity with multiple partners). The donor's travel history is screened to minimize risk of transmitting infections, such as malaria. The donated blood is tested for signs of infectious diseases, such as HIV and hepatitis. The blood is then tested to be sure it is compatible with you in order to minimize the chance of a transfusion reaction. If you or a relative donates blood, this is often done in anticipation of surgery and is not appropriate for emergency situations. It takes many days to process the donated blood. RISKS AND COMPLICATIONS Although transfusion therapy is very safe and saves many lives, the main dangers of transfusion include:   Getting an infectious disease.  Developing a transfusion reaction. This is an allergic reaction to  something in the blood you were given. Every precaution is taken to prevent this. The decision to have a blood transfusion has been considered carefully by your caregiver before blood is given. Blood is not given unless the benefits outweigh the risks. AFTER THE TRANSFUSION  Right after receiving a blood transfusion, you will usually feel much better and more energetic. This is especially true if your red blood cells have gotten low (anemic). The transfusion raises the level of the red blood cells which carry oxygen, and this usually causes an energy increase.  The nurse administering the transfusion will monitor you carefully for  complications. HOME CARE INSTRUCTIONS  No special instructions are needed after a transfusion. You may find your energy is better. Speak with your caregiver about any limitations on activity for underlying diseases you may have. SEEK MEDICAL CARE IF:   Your condition is not improving after your transfusion.  You develop redness or irritation at the intravenous (IV) site. SEEK IMMEDIATE MEDICAL CARE IF:  Any of the following symptoms occur over the next 12 hours:  Shaking chills.  You have a temperature by mouth above 102 F (38.9 C), not controlled by medicine.  Chest, back, or muscle pain.  People around you feel you are not acting correctly or are confused.  Shortness of breath or difficulty breathing.  Dizziness and fainting.  You get a rash or develop hives.  You have a decrease in urine output.  Your urine turns a dark color or changes to pink, red, or brown. Any of the following symptoms occur over the next 10 days:  You have a temperature by mouth above 102 F (38.9 C), not controlled by medicine.  Shortness of breath.  Weakness after normal activity.  The white part of the eye turns yellow (jaundice).  You have a decrease in the amount of urine or are urinating less often.  Your urine turns a dark color or changes to pink, red, or brown. Document Released: 02/21/2000 Document Revised: 05/18/2011 Document Reviewed: 10/10/2007 [facility] Patient Information 2014 [place], [place].  _______________________________________________________________________

## 2015-07-10 ENCOUNTER — Encounter (HOSPITAL_COMMUNITY): Payer: Self-pay

## 2015-07-10 ENCOUNTER — Telehealth: Payer: Self-pay

## 2015-07-10 ENCOUNTER — Encounter (HOSPITAL_COMMUNITY)
Admission: RE | Admit: 2015-07-10 | Discharge: 2015-07-10 | Disposition: A | Discharge status: Home / Self Care | Payer: Medicaid Other | Source: Ambulatory Visit | Attending: Obstetrics & Gynecology | Admitting: Obstetrics & Gynecology

## 2015-07-10 DIAGNOSIS — Z01812 Encounter for preprocedural laboratory examination: Secondary | ICD-10-CM | POA: Diagnosis not present

## 2015-07-10 DIAGNOSIS — N83201 Unspecified ovarian cyst, right side: Secondary | ICD-10-CM

## 2015-07-10 HISTORY — DX: Gastro-esophageal reflux disease without esophagitis: K21.9

## 2015-07-10 HISTORY — DX: Allergy status to unspecified drugs, medicaments and biological substances: Z88.9

## 2015-07-10 LAB — COMPREHENSIVE METABOLIC PANEL
ALBUMIN: 4.1 g/dL (ref 3.5–5.0)
ALK PHOS: 60 U/L (ref 38–126)
ALT: 15 U/L (ref 14–54)
AST: 20 U/L (ref 15–41)
Anion gap: 6 (ref 5–15)
BILIRUBIN TOTAL: 0.5 mg/dL (ref 0.3–1.2)
BUN: 14 mg/dL (ref 6–20)
CALCIUM: 8.4 mg/dL — AB (ref 8.9–10.3)
CO2: 29 mmol/L (ref 22–32)
Chloride: 110 mmol/L (ref 101–111)
Creatinine, Ser: 0.78 mg/dL (ref 0.44–1.00)
GFR calc Af Amer: >60 mL/min (ref >60)
GFR calc non Af Amer: >60 mL/min (ref >60)
GLUCOSE: 68 mg/dL (ref 65–99)
Potassium: 4.1 mmol/L (ref 3.5–5.1)
SODIUM: 145 mmol/L (ref 135–145)
TOTAL PROTEIN: 6.8 g/dL (ref 6.5–8.1)

## 2015-07-10 LAB — ABO/RH: ABO/RH(D): A POS

## 2015-07-10 LAB — PREGNANCY, URINE: Preg Test, Ur: NEGATIVE

## 2015-07-10 LAB — CBC WITH DIFFERENTIAL/PLATELET
BASOS ABS: 0 10*3/uL (ref 0.0–0.1)
BASOS PCT: 0 %
EOS ABS: 0.2 10*3/uL (ref 0.0–0.7)
Eosinophils Relative: 2 %
HEMATOCRIT: 42.1 % (ref 36.0–46.0)
HEMOGLOBIN: 13.9 g/dL (ref 12.0–15.0)
Lymphocytes Relative: 19 %
Lymphs Abs: 1.8 10*3/uL (ref 0.7–4.0)
MCH: 32.3 pg (ref 26.0–34.0)
MCHC: 33 g/dL (ref 30.0–36.0)
MCV: 97.7 fL (ref 78.0–100.0)
Monocytes Absolute: 0.8 10*3/uL (ref 0.1–1.0)
Monocytes Relative: 8 %
NEUTROS ABS: 7.1 10*3/uL (ref 1.7–7.7)
NEUTROS PCT: 71 %
Platelets: 214 10*3/uL (ref 150–400)
RBC: 4.31 MIL/uL (ref 3.87–5.11)
RDW: 13.4 % (ref 11.5–15.5)
WBC: 9.9 10*3/uL (ref 4.0–10.5)

## 2015-07-10 MED ORDER — TRAMADOL HCL 50 MG PO TABS: 50.0000 mg | ORAL_TABLET | Freq: Three times a day (TID) | ORAL | Status: DC | PRN

## 2015-07-10 NOTE — Pre-Procedure Instructions (Addendum)
07-10-15 History update with Dr. Acey Lavarignan- no urine drug screen required AM of surgery preop, No CXR needed today.4-17 CT  Abdomen/pelvis with chart-[facility]. 07-10-15 Pt given work related noted for today visit and surgery date. 07-10-15 Pt has signed 07-01-15 Sterilization consent with chart .

## 2015-07-10 NOTE — Telephone Encounter (Signed)
Patient's call returned , patient states she is having pain located to her "right" side that is a 8/10 that is "throbbing" for the past 5 days. Patient states she was taking "ativan" 2 times daily , but that is not relieving her pain. Patient has her Pre Admission Testing toady at 11:00 AM , surgery is scheduled for Tuesday Jul 16, 2015 . Patient is requesting something for pain , will update Melissa Cross , APNP . Patient informed that she will receive a call back with recommendations, patient denies further questions at this time.

## 2015-07-10 NOTE — Addendum Note (Signed)
Addended by: Teressa LowerBORTMANN, Avie Checo M on: 07/10/2015 12:28 PM   Modules accepted: Orders

## 2015-07-10 NOTE — Telephone Encounter (Signed)
[facility]  for pain control : Ultram 50 MG PO every 8 hours as needed for pain , QTY: 15 , no refills. Patient contacted and updated with recommendations , patient states that "ultram" does not work for her . Patient informed that Dr Adolphus BirchwoodEmma Rossi will prescribe a narcotic for post-op pain after her surgery and if the pain is severe she should go to the ED for evaluation . Patient states understanding and agreed to to plan of care, denies further questions at this time.

## 2015-07-16 ENCOUNTER — Ambulatory Visit (HOSPITAL_COMMUNITY)
Admission: RE | Admit: 2015-07-16 | Discharge: 2015-07-16 | Disposition: A | Discharge status: Home / Self Care | Payer: Medicaid Other | Source: Ambulatory Visit | Attending: Obstetrics & Gynecology | Admitting: Obstetrics & Gynecology

## 2015-07-16 ENCOUNTER — Encounter (HOSPITAL_COMMUNITY)
Admission: RE | Disposition: A | Discharge status: Home / Self Care | Payer: Self-pay | Source: Ambulatory Visit | Attending: Obstetrics & Gynecology

## 2015-07-16 ENCOUNTER — Ambulatory Visit (HOSPITAL_COMMUNITY): Payer: Medicaid Other | Admitting: Anesthesiology

## 2015-07-16 ENCOUNTER — Encounter (HOSPITAL_COMMUNITY): Payer: Self-pay | Admitting: Anesthesiology

## 2015-07-16 DIAGNOSIS — N801 Endometriosis of ovary: Secondary | ICD-10-CM | POA: Insufficient documentation

## 2015-07-16 DIAGNOSIS — N809 Endometriosis, unspecified: Secondary | ICD-10-CM

## 2015-07-16 DIAGNOSIS — N8311 Corpus luteum cyst of right ovary: Secondary | ICD-10-CM | POA: Diagnosis not present

## 2015-07-16 DIAGNOSIS — N83201 Unspecified ovarian cyst, right side: Secondary | ICD-10-CM

## 2015-07-16 DIAGNOSIS — E43 Unspecified severe protein-calorie malnutrition: Secondary | ICD-10-CM | POA: Diagnosis not present

## 2015-07-16 DIAGNOSIS — F17210 Nicotine dependence, cigarettes, uncomplicated: Secondary | ICD-10-CM | POA: Diagnosis not present

## 2015-07-16 DIAGNOSIS — R971 Elevated cancer antigen 125 [CA 125]: Secondary | ICD-10-CM | POA: Diagnosis not present

## 2015-07-16 DIAGNOSIS — N736 Female pelvic peritoneal adhesions (postinfective): Secondary | ICD-10-CM | POA: Diagnosis not present

## 2015-07-16 DIAGNOSIS — K219 Gastro-esophageal reflux disease without esophagitis: Secondary | ICD-10-CM | POA: Insufficient documentation

## 2015-07-16 DIAGNOSIS — F419 Anxiety disorder, unspecified: Secondary | ICD-10-CM | POA: Insufficient documentation

## 2015-07-16 DIAGNOSIS — N83202 Unspecified ovarian cyst, left side: Secondary | ICD-10-CM | POA: Diagnosis present

## 2015-07-16 HISTORY — PX: ROBOTIC ASSISTED SALPINGO OOPHERECTOMY: SHX6082

## 2015-07-16 HISTORY — DX: Motion sickness, initial encounter: T75.3XXA

## 2015-07-16 HISTORY — DX: Family history of other specified conditions: Z84.89

## 2015-07-16 LAB — TYPE AND SCREEN
ABO/RH(D): A POS
ANTIBODY SCREEN: NEGATIVE

## 2015-07-16 SURGERY — ROBOTIC ASSISTED SALPINGO OOPHORECTOMY
Anesthesia: General | Site: Abdomen | Laterality: Right

## 2015-07-16 MED ORDER — FENTANYL CITRATE (PF) 100 MCG/2ML IJ SOLN
INTRAMUSCULAR | Status: DC | PRN
Start: 2015-07-16 — End: 2015-07-16
  Administered 2015-07-16: 100 ug via INTRAVENOUS
  Administered 2015-07-16: 50 ug via INTRAVENOUS

## 2015-07-16 MED ORDER — SUGAMMADEX SODIUM 200 MG/2ML IV SOLN
INTRAVENOUS | Status: DC | PRN
  Administered 2015-07-16: 200 mg via INTRAVENOUS

## 2015-07-16 MED ORDER — ACETAMINOPHEN 650 MG RE SUPP
650.0000 mg | RECTAL | Status: DC | PRN
  Filled 2015-07-16: qty 1

## 2015-07-16 MED ORDER — METOCLOPRAMIDE HCL 5 MG/ML IJ SOLN: 10.0000 mg | Freq: Once | INTRAMUSCULAR | Status: DC | PRN

## 2015-07-16 MED ORDER — DEXAMETHASONE SODIUM PHOSPHATE 10 MG/ML IJ SOLN
INTRAMUSCULAR | Status: DC | PRN
  Administered 2015-07-16: 10 mg via INTRAVENOUS

## 2015-07-16 MED ORDER — STERILE WATER FOR IRRIGATION IR SOLN
Status: DC | PRN
  Administered 2015-07-16: 1000 mL

## 2015-07-16 MED ORDER — TRAMADOL HCL 50 MG PO TABS: 50.0000 mg | ORAL_TABLET | Freq: Four times a day (QID) | ORAL | Status: DC | PRN

## 2015-07-16 MED ORDER — OXYCODONE HCL 5 MG PO TABS
5.0000 mg | ORAL_TABLET | ORAL | Status: DC | PRN
  Administered 2015-07-16: 5 mg via ORAL
  Filled 2015-07-16: qty 1

## 2015-07-16 MED ORDER — LACTATED RINGERS IV SOLN
INTRAVENOUS | Status: DC | PRN
  Administered 2015-07-16: 1000 mL

## 2015-07-16 MED ORDER — MIDAZOLAM HCL 5 MG/5ML IJ SOLN
INTRAMUSCULAR | Status: DC | PRN
  Administered 2015-07-16: 2 mg via INTRAVENOUS

## 2015-07-16 MED ORDER — HYDROMORPHONE HCL 1 MG/ML IJ SOLN: 0.2500 mg | INTRAMUSCULAR | Status: DC | PRN

## 2015-07-16 MED ORDER — KETOROLAC TROMETHAMINE 30 MG/ML IJ SOLN
INTRAMUSCULAR | Status: AC
  Filled 2015-07-16: qty 1

## 2015-07-16 MED ORDER — SODIUM CHLORIDE 0.9 % IV SOLN: 250.0000 mL | INTRAVENOUS | Status: DC | PRN

## 2015-07-16 MED ORDER — PROPOFOL 10 MG/ML IV BOLUS
INTRAVENOUS | Status: AC
  Filled 2015-07-16: qty 20

## 2015-07-16 MED ORDER — ROCURONIUM BROMIDE 100 MG/10ML IV SOLN
INTRAVENOUS | Status: DC | PRN
  Administered 2015-07-16: 30 mg via INTRAVENOUS
  Administered 2015-07-16: 10 mg via INTRAVENOUS

## 2015-07-16 MED ORDER — ACETAMINOPHEN 325 MG PO TABS: 650.0000 mg | ORAL_TABLET | ORAL | Status: DC | PRN

## 2015-07-16 MED ORDER — KETOROLAC TROMETHAMINE 30 MG/ML IJ SOLN
INTRAMUSCULAR | Status: DC | PRN
  Administered 2015-07-16: 30 mg via INTRAVENOUS

## 2015-07-16 MED ORDER — SODIUM CHLORIDE 0.9% FLUSH: 3.0000 mL | INTRAVENOUS | Status: DC | PRN

## 2015-07-16 MED ORDER — MIDAZOLAM HCL 2 MG/2ML IJ SOLN
INTRAMUSCULAR | Status: AC
  Filled 2015-07-16: qty 2

## 2015-07-16 MED ORDER — SCOPOLAMINE 1 MG/3DAYS TD PT72
MEDICATED_PATCH | TRANSDERMAL | Status: AC
  Filled 2015-07-16: qty 1

## 2015-07-16 MED ORDER — LIDOCAINE HCL (CARDIAC) 20 MG/ML IV SOLN
INTRAVENOUS | Status: DC | PRN
  Administered 2015-07-16: 60 mg via INTRAVENOUS

## 2015-07-16 MED ORDER — ONDANSETRON HCL 4 MG/2ML IJ SOLN
INTRAMUSCULAR | Status: AC
  Filled 2015-07-16: qty 2

## 2015-07-16 MED ORDER — KETOROLAC TROMETHAMINE 30 MG/ML IJ SOLN: 30.0000 mg | Freq: Four times a day (QID) | INTRAMUSCULAR | Status: DC

## 2015-07-16 MED ORDER — LACTATED RINGERS IV SOLN
INTRAVENOUS | Status: DC
  Administered 2015-07-16 (×2): via INTRAVENOUS

## 2015-07-16 MED ORDER — ONDANSETRON HCL 4 MG/2ML IJ SOLN
INTRAMUSCULAR | Status: DC | PRN
  Administered 2015-07-16: 4 mg via INTRAVENOUS

## 2015-07-16 MED ORDER — PROPOFOL 10 MG/ML IV BOLUS
INTRAVENOUS | Status: DC | PRN
  Administered 2015-07-16: 50 mg via INTRAVENOUS
  Administered 2015-07-16: 150 mg via INTRAVENOUS
  Administered 2015-07-16: 50 mg via INTRAVENOUS

## 2015-07-16 MED ORDER — BUPIVACAINE-EPINEPHRINE (PF) 0.25% -1:200000 IJ SOLN
INTRAMUSCULAR | Status: AC
  Filled 2015-07-16: qty 30

## 2015-07-16 MED ORDER — SODIUM CHLORIDE 0.9% FLUSH: 3.0000 mL | Freq: Two times a day (BID) | INTRAVENOUS | Status: DC

## 2015-07-16 MED ORDER — LIDOCAINE HCL (CARDIAC) 20 MG/ML IV SOLN
INTRAVENOUS | Status: AC
  Filled 2015-07-16: qty 5

## 2015-07-16 MED ORDER — FENTANYL CITRATE (PF) 100 MCG/2ML IJ SOLN: 25.0000 ug | INTRAMUSCULAR | Status: DC | PRN

## 2015-07-16 MED ORDER — SCOPOLAMINE 1 MG/3DAYS TD PT72
1.0000 | MEDICATED_PATCH | TRANSDERMAL | Status: DC
  Administered 2015-07-16: 1.5 mg via TRANSDERMAL

## 2015-07-16 MED ORDER — CEFAZOLIN SODIUM-DEXTROSE 2-4 GM/100ML-% IV SOLN
2.0000 g | INTRAVENOUS | Status: AC
  Administered 2015-07-16: 2 g via INTRAVENOUS
  Filled 2015-07-16: qty 100

## 2015-07-16 MED ORDER — CEFAZOLIN SODIUM-DEXTROSE 2-4 GM/100ML-% IV SOLN
INTRAVENOUS | Status: AC
  Filled 2015-07-16: qty 100

## 2015-07-16 MED ORDER — MEPERIDINE HCL 50 MG/ML IJ SOLN: 6.2500 mg | INTRAMUSCULAR | Status: DC | PRN

## 2015-07-16 MED ORDER — FENTANYL CITRATE (PF) 250 MCG/5ML IJ SOLN
INTRAMUSCULAR | Status: AC
  Filled 2015-07-16: qty 5

## 2015-07-16 MED ORDER — DEXAMETHASONE SODIUM PHOSPHATE 10 MG/ML IJ SOLN
INTRAMUSCULAR | Status: AC
  Filled 2015-07-16: qty 1

## 2015-07-16 SURGICAL SUPPLY — 82 items
APPLICATOR ARISTA FLEXITIP XL (MISCELLANEOUS) ×4 IMPLANT
APPLICATOR SURGIFLO ENDO (HEMOSTASIS) ×4 IMPLANT
ATTRACTOMAT 16X20 MAGNETIC DRP (DRAPES) ×4 IMPLANT
BENZOIN TINCTURE PRP APPL 2/3 (GAUZE/BANDAGES/DRESSINGS) ×4 IMPLANT
CHLORAPREP W/TINT 26ML (MISCELLANEOUS) ×4 IMPLANT
CLIP TI MEDIUM LARGE 6 (CLIP) ×4 IMPLANT
CLOSURE WOUND 1/2 X4 (GAUZE/BANDAGES/DRESSINGS) ×1
CONT SPEC 4OZ CLIKSEAL STRL BL (MISCELLANEOUS) ×4 IMPLANT
CORDS BIPOLAR (ELECTRODE) ×4 IMPLANT
COVER BACK TABLE 60X90IN (DRAPES) ×8 IMPLANT
COVER SURGICAL LIGHT HANDLE (MISCELLANEOUS) ×4 IMPLANT
COVER TIP SHEARS 8 DVNC (MISCELLANEOUS) ×2 IMPLANT
COVER TIP SHEARS 8MM DA VINCI (MISCELLANEOUS) ×2
DRAPE COLUMN DVNC XI (DISPOSABLE) IMPLANT
DRAPE DA VINCI XI COLUMN (DISPOSABLE)
DRAPE SHEET LG 3/4 BI-LAMINATE (DRAPES) ×8 IMPLANT
DRAPE SURG IRRIG POUCH 19X23 (DRAPES) ×4 IMPLANT
DRAPE UTILITY XL STRL (DRAPES) ×4 IMPLANT
DRAPE WARM FLUID 44X44 (DRAPE) ×4 IMPLANT
DRSG TEGADERM 2-3/8X2-3/4 SM (GAUZE/BANDAGES/DRESSINGS) ×12 IMPLANT
DRSG TEGADERM 4X4.75 (GAUZE/BANDAGES/DRESSINGS) ×8 IMPLANT
DRSG TEGADERM 6X8 (GAUZE/BANDAGES/DRESSINGS) ×8 IMPLANT
ELECT REM PT RETURN 9FT ADLT (ELECTROSURGICAL) ×4
ELECTRODE REM PT RTRN 9FT ADLT (ELECTROSURGICAL) ×2 IMPLANT
GAUZE SPONGE 2X2 8PLY STRL LF (GAUZE/BANDAGES/DRESSINGS) ×4 IMPLANT
GAUZE SPONGE 4X4 12PLY STRL (GAUZE/BANDAGES/DRESSINGS) ×4 IMPLANT
GAUZE SPONGE 4X4 16PLY XRAY LF (GAUZE/BANDAGES/DRESSINGS) ×4 IMPLANT
GLOVE BIO SURGEON STRL SZ 6.5 (GLOVE) ×6 IMPLANT
GLOVE BIO SURGEON STRL SZ7.5 (GLOVE) ×12 IMPLANT
GLOVE BIO SURGEONS STRL SZ 6.5 (GLOVE) ×2
GLOVE INDICATOR 8.0 STRL GRN (GLOVE) ×8 IMPLANT
GOWN BRE IMP PREV XXLGXLNG (GOWN DISPOSABLE) ×4 IMPLANT
GOWN STRL NON-REIN LRG LVL3 (GOWN DISPOSABLE) ×4 IMPLANT
GOWN STRL REUS W/ TWL LRG LVL3 (GOWN DISPOSABLE) ×2 IMPLANT
GOWN STRL REUS W/TWL LRG LVL3 (GOWN DISPOSABLE) ×2
GOWN STRL REUS W/TWL XL LVL3 (GOWN DISPOSABLE) ×8 IMPLANT
HEMOSTAT ARISTA ABSORB 3G PWDR (MISCELLANEOUS) ×4 IMPLANT
HOLDER FOLEY CATH W/STRAP (MISCELLANEOUS) ×4 IMPLANT
KIT BASIN OR (CUSTOM PROCEDURE TRAY) ×4 IMPLANT
MANIPULATOR UTERINE 4.5 ZUMI (MISCELLANEOUS) IMPLANT
MARKER SKIN DUAL TIP RULER LAB (MISCELLANEOUS) ×4 IMPLANT
NS IRRIG 1000ML POUR BTL (IV SOLUTION) ×16 IMPLANT
OCCLUDER COLPOPNEUMO (BALLOONS) IMPLANT
PACK GENERAL/GYN (CUSTOM PROCEDURE TRAY) ×4 IMPLANT
POUCH SPECIMEN RETRIEVAL 10MM (ENDOMECHANICALS) ×8 IMPLANT
SEPRAFILM PROCEDURAL PACK 3X5 (MISCELLANEOUS) ×4 IMPLANT
SET TUBE IRRIG SUCTION NO TIP (IRRIGATION / IRRIGATOR) ×4 IMPLANT
SHEET LAVH (DRAPES) ×4 IMPLANT
SOLUTION ANTI FOG 6CC (MISCELLANEOUS) ×4 IMPLANT
SOLUTION ELECTROLUBE (MISCELLANEOUS) ×4 IMPLANT
SPONGE GAUZE 2X2 STER 10/PKG (GAUZE/BANDAGES/DRESSINGS) ×4
SPONGE LAP 18X18 X RAY DECT (DISPOSABLE) ×8 IMPLANT
STRIP CLOSURE SKIN 1/2X4 (GAUZE/BANDAGES/DRESSINGS) ×3 IMPLANT
SUT ETHILON 1 LR 30 (SUTURE) IMPLANT
SUT PDS AB 0 CTX 60 (SUTURE) ×8 IMPLANT
SUT SILK 2 0 (SUTURE) ×2
SUT SILK 2 0 30  PSL (SUTURE)
SUT SILK 2 0 30 PSL (SUTURE) IMPLANT
SUT SILK 2-0 18XBRD TIE 12 (SUTURE) ×2 IMPLANT
SUT VIC AB 0 CT1 27 (SUTURE) ×6
SUT VIC AB 0 CT1 27XBRD ANTBC (SUTURE) ×6 IMPLANT
SUT VIC AB 0 CT1 36 (SUTURE) ×8 IMPLANT
SUT VIC AB 2-0 CT2 27 (SUTURE) IMPLANT
SUT VIC AB 2-0 SH 27 (SUTURE) ×4
SUT VIC AB 2-0 SH 27X BRD (SUTURE) ×4 IMPLANT
SUT VIC AB 3-0 CTX 36 (SUTURE) IMPLANT
SUT VIC AB 4-0 PS2 27 (SUTURE) ×8 IMPLANT
SUT VICRYL 0 TIES 12 18 (SUTURE) ×4 IMPLANT
SYR 50ML LL SCALE MARK (SYRINGE) ×4 IMPLANT
SYR BULB IRRIGATION 50ML (SYRINGE) IMPLANT
TOWEL OR 17X26 10 PK STRL BLUE (TOWEL DISPOSABLE) ×8 IMPLANT
TOWEL OR NON WOVEN STRL DISP B (DISPOSABLE) ×4 IMPLANT
TRAP SPECIMEN MUCOUS 40CC (MISCELLANEOUS) ×4 IMPLANT
TRAY FOLEY BAG SILVER LF 14FR (CATHETERS) ×4 IMPLANT
TRAY FOLEY W/METER SILVER 14FR (SET/KITS/TRAYS/PACK) ×4 IMPLANT
TRAY FOLEY W/METER SILVER 16FR (SET/KITS/TRAYS/PACK) ×4 IMPLANT
TRAY LAPAROSCOPIC (CUSTOM PROCEDURE TRAY) ×4 IMPLANT
TROCAR 12M 150ML BLUNT (TROCAR) ×4 IMPLANT
TROCAR BLADELESS OPT 5 100 (ENDOMECHANICALS) ×4 IMPLANT
TROCAR XCEL 12X100 BLDLESS (ENDOMECHANICALS) ×4 IMPLANT
TUBING INSUF HEATED (TUBING) ×4 IMPLANT
WATER STERILE IRR 1500ML POUR (IV SOLUTION) ×8 IMPLANT

## 2015-07-16 NOTE — Discharge Instructions (Signed)
Unilateral Salpingo-Oophorectomy, Care After °Refer to this sheet in the next few weeks. These instructions provide you with information on caring for yourself after your procedure. Your health care provider may also give you more specific instructions. Your treatment has been planned according to current medical practices, but problems sometimes occur. Call your health care provider if you have any problems or questions after your procedure. °WHAT TO EXPECT AFTER THE PROCEDURE °After your procedure, it is typical to have the following: °· Abdominal pain that can be controlled with pain medicine. °· Vaginal spotting. °· Constipation. °HOME CARE INSTRUCTIONS  °· Get plenty of rest and sleep. °· Only take over-the-counter or prescription medicines as directed by your health care provider. Do not take aspirin. It can cause bleeding. °· Keep incision areas clean and dry. Remove or change any bandages (dressings) only as directed by your health care provider. °· Follow your health care provider's advice regarding diet. °· Drink enough fluids to keep your urine clear or pale yellow. °· Limit exercise and activities as directed by your health care provider. Do not lift anything heavier than 5 pounds (2.3 kg) until your health care provider approves. °· Do not drive until your health care provider approves. °· Do not drink alcohol until your health care provider approves. °· Do not have sexual intercourse until your health care provider says it is OK. °· Take your temperature twice a day and write it down. °· If you become constipated, you may: °¨ Ask your health care provider about taking a mild laxative. °¨ Add more fruit and bran to your diet. °¨ Drink more fluids. °· Follow up with your health care provider as directed. °SEEK MEDICAL CARE IF:  °· You have swelling or redness in the incision area. °· You develop a rash. °· You feel lightheaded. °· You have pain that is not controlled with medicine. °· You have pain,  swelling, or redness where the IV access tube was placed. °SEEK IMMEDIATE MEDICAL CARE IF: °· You have a fever. °· You develop increasing abdominal pain. °· You see pus coming out of the incision, or the incision is separating. °· You notice a bad smell coming from the wound or dressing. °· You have excessive vaginal bleeding. °· You feel sick to your stomach (nauseous) and vomit. °· You have leg or chest pain. °· You have pain when you urinate. °· You develop shortness of breath. °· You pass out. °  °This information is not intended to replace advice given to you by your health care provider. Make sure you discuss any questions you have with your health care provider. °  °Document Released: 12/20/2008 Document Revised: 12/14/2012 Document Reviewed: 08/17/2012 °Elsevier Interactive Patient Education ©2016 [employer] ° °

## 2015-07-16 NOTE — Anesthesia Postprocedure Evaluation (Addendum)
Anesthesia Post Note  Patient: Meredith Perez  Procedure(s) Performed: Procedure(s) (LRB): ROBOTIC ASSISTED RIGHT cystectomy lysis of adhesions (Right)  Patient location during evaluation: PACU Anesthesia Type: General Level of consciousness: awake and alert Pain management: pain level controlled Vital Signs Assessment: post-procedure vital signs reviewed and stable Respiratory status: spontaneous breathing, nonlabored ventilation, respiratory function stable and patient connected to nasal cannula oxygen Cardiovascular status: blood pressure returned to baseline and stable Postop Assessment: no signs of nausea or vomiting Anesthetic complications: no    Last Vitals:  Spoke with Animal nutritionistTracy RN (PACU) who states patient's blood pressure was 100 systolic when patient returned to short stay. Patient asymptomatic.    Last Pain:  Filed Vitals:   07/16/15 1427  PainSc: 8                  Binnie Droessler J

## 2015-07-16 NOTE — Interval H&P Note (Signed)
History and Physical Interval Note:  07/16/2015 9:03 AM  Meredith Perez  has presented today for surgery, with the diagnosis of COMPLEX OVARIAN CYST  The various methods of treatment have been discussed with the patient and family. After consideration of risks, benefits and other options for treatment, the patient has consented to  Procedure(s): ROBOTIC ASSISTED RIGHT SALPINGO OOPHORECTOMY, POSSIBLE HYSTERECTOMY (Right) POSSIBLE BILATERAL SALPINGO OOPHORECTOMY, POSSIBLE STAGING  (Bilateral) as a surgical intervention .  The patient's history has been reviewed, patient examined, no change in status, stable for surgery.  I have reviewed the patient's chart and labs.  Questions were answered to the patient's satisfaction.     ConcepcionBREWSTER, [facility]

## 2015-07-16 NOTE — Progress Notes (Signed)
Pt c/o of abd pain "8". Offered pt tramadol, pt refused states "It does not work, only Microbiologistpercocet works for me" Dr Nelly RoutBRewster paged, waiting on call.

## 2015-07-16 NOTE — Anesthesia Preprocedure Evaluation (Addendum)
Anesthesia Evaluation  Patient identified by MRN, date of birth, ID band Patient awake    Reviewed: Allergy & Precautions, NPO status , Patient's Chart, lab work & pertinent test results  Airway Mallampati: I  TM Distance: >3 FB Neck ROM: Full    Dental no notable dental hx. (+) Teeth Intact   Pulmonary Current Smoker,    Pulmonary exam normal breath sounds clear to auscultation       Cardiovascular negative cardio ROS Normal cardiovascular exam Rhythm:Regular Rate:Normal     Neuro/Psych PSYCHIATRIC DISORDERS Anxiety Depression Panic disordernegative neurological ROS     GI/Hepatic GERD  Medicated and Controlled,(+)     substance abuse  cocaine use, Barbituates and Narcotics   Endo/Other  Complex right ovarian cyst Elevated CA-125  Renal/GU negative Renal ROS  negative genitourinary   Musculoskeletal negative musculoskeletal ROS (+)   Abdominal (+) + scaphoid   Peds  Hematology negative hematology ROS (+)   Anesthesia Other Findings   Reproductive/Obstetrics negative OB ROS                           Anesthesia Physical Anesthesia Plan  ASA: II  Anesthesia Plan: General   Post-op Pain Management:    Induction: Intravenous and Cricoid pressure planned  Airway Management Planned: Oral ETT  Additional Equipment:   Intra-op Plan:   Post-operative Plan: Extubation in OR  Informed Consent: I have reviewed the patients History and Physical, chart, labs and discussed the procedure including the risks, benefits and alternatives for the proposed anesthesia with the patient or authorized representative who has indicated his/her understanding and acceptance.   Dental advisory given  Plan Discussed with: CRNA, Anesthesiologist and Surgeon  Anesthesia Plan Comments:         Anesthesia Quick Evaluation

## 2015-07-16 NOTE — H&P (View-Only) (Signed)
Consult Note: Gyn-Onc  Consult was requested by Dr. Bass for the evaluation of Meredith Perez 30 y.o. female  CC:  Chief Complaint  Patient presents with  . Complex ovarian cyst    New Consultation    Assessment/Plan:  Meredith Perez  is a 30 y.o.  year old with a 6cm right ovarian thick walled complex cyst and elevated CA125 and chronic pelvic pain in the setting of severe malnutrition and a history of substance abuse.  I discussed with Andrew that overall my suspicion for malignancy is low. I have personally reviewed the images from her CT scan from [facility] on 06/11/15. She has a thickwalled right ovarian cyst with no ascites/peritoneal carcinomatosis/lymphadenopathy. The cyst is 6cm in size.  However, given its complex appearance, and elevation in tumor marker, and given her symptoms of pelvic pain, I recommend surgical removal (robotic assisted right salpingo-oophorectomy) and frozen section pathology. If malignancy is identified, staging would be performed BUT THIS WOULD BE FERTILITY PRESERVING (NO HYSTERECTOMY/LSO) UNLESS THESE STRUCTURES WERE GROSSLY INVOLVED WITH MALIGNANCY, in which case, she consents to hysterectomy/bso as part of debulking. Based on my review of her imaging and my exam, I do not believe hysterectomy/LSO will be necessary in order to stage a cancer if one is found.   I discussed the possibility of chronic persistent pain postop as she has had pain for 7 years, and it is unlikely to be exclusively a function of this ovarian mass. 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 She has requested ativan to help with her anxiety and pain leading up to the surgery. I prescribed 20 tabs of 0.5mg ativan leading up until surgery. We discussed that I would not provide additional refills for this and that we will only be able to  prescribe analgesic narcotic meds for 6 weeks postoperatively, and after that time we will not be able to prescribe additional narcotics if requested.  She has unexplained severe protein wasting malnutrition (albumin 3.3 and total protein 6.0). She has symptoms of nausea and diarrhea. She has not had a GI workup. I have concerns about a protein wasting enteropathy. I discussed that I doubt that surgery will resolve her GI symptoms as they are unlikely to be related to her cyst. I have discussed risk of wound non-healing and the importance of protein consumption in the form of nutritional supplements and protein containing meals preop.  I discussed operative risks including  bleeding, infection, damage to internal organs (such as bladder,ureters, bowels), blood clot, reoperation and rehospitalization. I discussed that if her ovarian mass is adherent to internal structures this increases that risk. I discussed that her malnutrition and smoking history place her at increased risk for these complications.   In order to expedite her surgical date given her pain symptoms, she has requested surgery to be performed by my partner, Dr Wendy Brewster on May, 9th.  HPI: Meredith Perez is a 30 year old G1P1 who is seen in consultation at the request of Dr Bass for a 6cm right ovarian mass and elevated CA 125.   The patient reports having and an approximate 7 year history of intermittent vague pelvic pain since 2010 after the delivery of her son. Prior to that she had an intrauterine device that was removed in 2009 due to symptoms of irregular spotting. But following its removal her irregular   bleeding and pain became worse. The pain persisted for many years but has become increasingly worse since the spring of 2017 at which time the pelvic pain became more constant and concentrated in the midline pelvis. She presented to [facility] ER on 06/11/2015. CT scan of the abdomen and pelvis with IV contrast was performed.  This demonstrated an anteverted uterus with a cyst arising in the right adnexa measuring 5.2 x 4.2 x 2.9 cm. The left ovary contained a 1.7 cm clot cyst. There is minimal free fluid in the cul-de-sac. There were no appreciable periappendiceal inflammation. There was no adenopathy, ascites, or peritoneal carcinomatosis identified.  The patient was then evaluated by Dr. Lawrence Bass in his office with a pelvic ultrasound scan on 06/14/15 which showed a 3.35x 2.4x 3.3cm, with papillary projections noted.   CA 125 was drawn on 06/14/15 and was elevated at 235. CEA and CA 19-9 were normal. WBC was normal. LFTs had been mildly elevated in ER (she had binged on ETOH the night before) but were normalized on 06/14/15. Albumin was 3.3 and total protein was 6.  The patient has many years of diarrhea and nausea. She has great difficulty gaining weight. She has ADD but is not on stimulant medication for this and denies non-prescription stimulant abuse (other than nicotine).  The patient has a remote (7 years ago) history of valium abuse.   Current Meds:  Outpatient Encounter Prescriptions as of 07/01/2015  Medication Sig  . buPROPion (WELLBUTRIN XL) 150 MG 24 hr tablet TAKE 1 TABLET (150 MG TOTAL) BY MOUTH EVERY MORNING. DEPRESSION, ATTENTION  . mirtazapine (REMERON) 30 MG tablet TAKE 1 TABLET (30 MG TOTAL) BY MOUTH NIGHTLY. DEPRESSION AND APPETITE  . ondansetron (ZOFRAN-ODT) 4 MG disintegrating tablet TAKE 1 TABLET ON THE TONGUE EVERY 6 HOURS AS NEEDED FOR NAUSEA/VOMITING  . [DISCONTINUED] hyoscyamine (LEVSIN SL) 0.125 MG SL tablet Place 1 tablet (0.125 mg total) under the tongue every 4 (four) hours as needed for cramping.  . LORazepam (ATIVAN) 0.5 MG tablet Take 1 tablet (0.5 mg total) by mouth at bedtime.  . [DISCONTINUED] bifidobacterium infantis (ALIGN) capsule Take 1 capsule by mouth daily.  . [DISCONTINUED] metroNIDAZOLE (FLAGYL) 500 MG tablet Take 500 mg by mouth 3 (three) times daily.  . [DISCONTINUED]  omeprazole (PRILOSEC) 40 MG capsule Take 1 capsule (40 mg total) by mouth daily.   No facility-administered encounter medications on file as of 07/01/2015.    Allergy:  Allergies  Allergen Reactions  . Sulfa Antibiotics Rash    Social Hx:   Social History   Social History  . Marital Status: Single    Spouse Name: N/A  . Number of Children: 1  . Years of Education: N/A   Occupational History  . Unemployed    Social History Main Topics  . Smoking status: Current Every Day Smoker -- 0.75 packs/day    Types: Cigarettes  . Smokeless tobacco: Never Used  . Alcohol Use: No  . Drug Use: No  . Sexual Activity: Not on file   Other Topics Concern  . Not on file   Social History Narrative    Past Surgical Hx: History reviewed. No pertinent past surgical history.  Past Medical Hx:  Past Medical History  Diagnosis Date  . Depression   . HGSIL (high grade squamous intraepithelial dysplasia)   . Panic disorder   . Skull fracture (HCC)   . Substance dependence (HCC)   . Acute cystitis   . Dysuria       Past Gynecological History:  SVD x 1  No LMP recorded.  Family Hx:  Family History  Problem Relation Age of Onset  . Breast cancer Paternal Grandmother   . Colon cancer    . Heart disease Maternal Grandfather     Review of Systems:  Constitutional  Feels generally unwell  ENT Normal appearing ears and nares bilaterally Skin/Breast  No rash, sores, jaundice, itching, dryness Cardiovascular  No chest pain, shortness of breath, or edema  Pulmonary  No cough or wheeze.  Gastro Intestinal  + nausea, vomitting, + diarrhoea. No bright red blood per rectum, + abdominal pain, Genito Urinary  No frequency, urgency, dysuria, no bleeding Musculo Skeletal  No myalgia, arthralgia, joint swelling or pain  Neurologic  No weakness, numbness, change in gait,  Psychology  No depression, anxiety, insomnia.    Vitals:  Blood pressure 113/53, pulse 73, temperature 97.6 F  (36.4 C), temperature source Oral, resp. rate 18, height  (1.626 m), weight 83 lb 11.2 oz (37.966 kg), SpO2 100 %.  Physical Exam: WD in NAD Neck  Supple NROM, without any enlargements.  Lymph Node Survey No cervical supraclavicular, + shotty bilateral inguinal adenopathy Cardiovascular  Pulse normal rate, regularity and rhythm. S1 and S2 normal.  Lungs  Clear to auscultation bilateraly, without wheezes/crackles/rhonchi. Good air movement.  Skin  No rash/lesions/breakdown  Psychiatry  Alert and oriented to person, place, and time  Abdomen  Normoactive bowel sounds, abdomen soft, non-tender and extremely thin without evidence of hernia.  Back No CVA tenderness Genito Urinary  Vulva/vagina: Normal external female genitalia.  No lesions. No discharge or bleeding.  Bladder/urethra:  No lesions or masses, well supported bladder  Vagina: normal  Cervix: Normal appearing, no lesions.  Uterus: Small, mobile, no parametrial involvement or nodularity.  Adnexa: fullness in the right adnexa, tender to palpate ++ Rectal  Good tone, no masses no cul de sac nodularity.  Extremities  No bilateral cyanosis, clubbing or edema.   Quinn Axe, MD  07/01/2015, 12:35 PM

## 2015-07-16 NOTE — Transfer of Care (Signed)
Immediate Anesthesia Transfer of Care Note  Patient: Meredith Perez  Procedure(s) Performed: Procedure(s) (LRB): ROBOTIC ASSISTED RIGHT cystectomy lysis of adhesions (Right)  Patient Location: PACU  Anesthesia Type: General  Level of Consciousness: awake, oriented, sedated and patient cooperative  Airway & Oxygen Therapy: Patient Spontanous Breathing and Patient connected to face mask oxygen  Post-op Assessment: Report given to PACU RN and Post -op Vital signs reviewed and stable  Post vital signs: Reviewed and stable  Complications: No apparent anesthesia complications Last Vitals:  Filed Vitals:   07/16/15 0715 07/16/15 1145  BP: 109/74 116/66  Pulse: 85 82  Temp: 36.4 C 36.5 C  Resp: 16 7

## 2015-07-16 NOTE — Op Note (Signed)
OPERATIVE NOTE  Preoperative Diagnosis: R adnexal mass, chronic pelvic pain, elevated CA125  Postoperative Diagnosis: Stage III endometriosis(36 points), right ovarian endometrioma  Procedure(s) Performed: Robotic right ovarian cystectomy.  Lysis of adhesions washings , peritoneal biopsy  Anesthesia: Gen. endotracheal  Surgeon: Maryclare Labrador.  Nelly Rout, M.D. PhD  Assistant Surgeon: Antionette Char MD.   Malka So MS III  Specimens: Pelvic washings, right ovarian cyst, peritoneal biopsy   Estimated Blood Loss: Minimal mL.   Complications: None  Indication for Procedure: This is a 30 y.o. with chronic pelvic pain, elevated CA125  and a right adnexal mass.  Operative Findings: evidence of old blood within the pelvis, stained omentum.  6cm right adnexal mass with ruture and return of hemosiderin.  Hemosiderin staining of the anterior cul de sac and appendix.  Staining along the paracolic gutters bilaterally.  Filmy adhesions of the adnexa to the posterior cul de sac, rectosigmoid colon and posterior uterus.  Normal-appearing  omentum liver surface and diaphragmatic surfaces. No evidence of metastatic disease.    Procedure: Patient was taken to the operating room and placed under general endotracheal anesthesia without any difficulty. She is placed in the dorsal lithotomy position and secured to the operative table over the chest with tape.   The patient was prepped and draped and the uterine manipulator placed within the endometrial cavity. A Hulka tenaculum was placed.  An OG tube was present and functional. At an area on the left in line with the nipple approximately 2 cm below the ribs a skin incision was made and a 5 mm Optiview inserted under direct visualization. The abdomen was insufflated to 15 mm of mercury and the pressure never deviated above that throughout the remainder of the procedure. Maximum Trendelenburg positioning was obtained. At approximately 22 cm proximal to the  symphysis pubis an incision was made just superior to the umbilicus.  the location 10 cm bilateral to this incision 8 mm Millimeter robotic ports were placed in the other 3 incisions. The left upper quadrant port site was replaced with a 12 mm port. This was all completed under direct visualization. The small and large bowel were reflected as much as possible into the upper abdomen. The robot was docked and instruments placed.  The filmy adhesions encasing the adnexa bilaterally to the posterior cul de sac, posterior uterus and rectosigmoid colon were resected.  In the process there was rupture of the right endometrioma.  A incision was made over the posterior surface of the right ovary.  The endometrioma was removed.  Hemostasis of the base was obtained. The pelvis was irrigated and drained.  Intra-abdominal pressure was reduced with evidence of a small amount of bleeding.  Hemostasis was assured and Arista was placed in the surgical bed with hemostasis.  A slurry of seprafilm was placed over the left ovary and tube.   The areas of surgical dissection were inspected and hemostasis was assured.  The robot was undocked and the specimens were delivered through the left upper quadrant port  The instruments were removed from the abdomen and pelvis and the port sites irrigated. The fascial tissue in the left upper quadrant was closed with an interrupted 0 Vicryl  suture. All skin incisions were closed with a subcuticular suture. Dermabond was applied,  The vaginal vault was cleared with a moist sponge stick.  Sponge, lap and needle counts were correct x 3.    The patient had sequential compression devices.         Disposition: PACU -  hemodynamically stable.         Condition:stable Foley draining clear urine.

## 2015-07-16 NOTE — Progress Notes (Signed)
Dr Tamela OddiJackson-Avy Barlett at pts bedside, received prescription of percocet

## 2015-07-16 NOTE — Anesthesia Procedure Notes (Signed)
Procedure Name: Intubation Date/Time: 07/16/2015 9:47 AM Performed by: Thornell MuleSTUBBLEFIELD, Hisae Decoursey G Pre-anesthesia Checklist: Patient identified, Emergency Drugs available, Suction available and Patient being monitored Patient Re-evaluated:Patient Re-evaluated prior to inductionOxygen Delivery Method: Circle System Utilized Preoxygenation: Pre-oxygenation with 100% oxygen Intubation Type: IV induction Ventilation: Mask ventilation without difficulty Laryngoscope Size: Miller and 3 Grade View: Grade I Tube type: Oral Tube size: 7.0 mm Number of attempts: 1 Airway Equipment and Method: Stylet and Oral airway Placement Confirmation: ETT inserted through vocal cords under direct vision,  positive ETCO2 and breath sounds checked- equal and bilateral Secured at: 20 cm Tube secured with: Tape Dental Injury: Teeth and Oropharynx as per pre-operative assessment

## 2015-07-17 NOTE — Addendum Note (Signed)
Addendum  created 07/17/15 1010 by Elyn PeersSandra J Jeslin Bazinet, CRNA   Modules edited: Charges VN

## 2015-07-18 ENCOUNTER — Telehealth: Payer: Self-pay

## 2015-07-18 NOTE — Telephone Encounter (Signed)
Orders received from Melissa Cross,APNP to contact the patient with her surgical pathology report : "no cancer sen" . Patient contcated and updated with the surgical pathology results , patient states understanding , denies further questions at this time.

## 2015-07-31 ENCOUNTER — Telehealth: Payer: Self-pay

## 2015-07-31 DIAGNOSIS — G8918 Other acute postprocedural pain: Secondary | ICD-10-CM

## 2015-07-31 MED ORDER — TRAMADOL HCL 50 MG PO TABS: 50.0000 mg | ORAL_TABLET | Freq: Four times a day (QID) | ORAL | Status: DC | PRN

## 2015-07-31 NOTE — Telephone Encounter (Signed)
Patient of Dr Kara MeadEmma Perez's called , patient requesting a refill on her Ultram related to "surgical" pain. Patient also requesting Ativan refill ,states she has been experiencing anxiety .Melissa Cross ,APNP updated , Ultram approved , will update Dr Adolphus BirchwoodEmma Perez with the request for Ativan refill and contact the patient with approval or denial . Patient states understanding, denies further questions at this time .

## 2015-08-02 ENCOUNTER — Telehealth: Payer: Self-pay

## 2015-08-02 NOTE — Telephone Encounter (Signed)
Attempted to contact the patient to update her with the denial for Ativan from Dr Adolphus BirchwoodEmma Rossi . Patient's mobile minutes are used unable to leave a voice message , called her home phone . Patient not available , spoke with the patient's mother and updated her with the denial for Ativan. Melaine patient's mother states understanding ,denies further questions at this time.

## 2015-08-15 ENCOUNTER — Telehealth: Payer: Self-pay

## 2015-08-15 NOTE — Telephone Encounter (Signed)
Patient's call returned , patient informed that Dr Laurette SchimkeWendy Brewster has denied her request for Tramadol . Dr Forrestine HimBrewster's recommendations are to alternate between Tylenol and Ibuprofen . Attempted to contact the patient , no answer , left a detailed message with call back number provided , updated Melissa Cross, APNP.

## 2015-08-19 ENCOUNTER — Ambulatory Visit: Payer: Medicaid Other | Admitting: Gynecologic Oncology

## 2015-08-21 ENCOUNTER — Encounter: Payer: Self-pay | Admitting: Gynecologic Oncology

## 2015-08-21 ENCOUNTER — Ambulatory Visit: Payer: Medicaid Other | Attending: Gynecologic Oncology | Admitting: Gynecologic Oncology

## 2015-08-21 VITALS — BP 108/65 | HR 82 | Temp 98.4°F | Resp 18 | Ht 63.0 in | Wt 92.0 lb

## 2015-08-21 DIAGNOSIS — Z9889 Other specified postprocedural states: Secondary | ICD-10-CM | POA: Insufficient documentation

## 2015-08-21 DIAGNOSIS — N83299 Other ovarian cyst, unspecified side: Secondary | ICD-10-CM

## 2015-08-21 DIAGNOSIS — Z90721 Acquired absence of ovaries, unilateral: Secondary | ICD-10-CM

## 2015-08-21 DIAGNOSIS — E46 Unspecified protein-calorie malnutrition: Secondary | ICD-10-CM | POA: Diagnosis not present

## 2015-08-21 DIAGNOSIS — N801 Endometriosis of ovary: Secondary | ICD-10-CM | POA: Diagnosis not present

## 2015-08-21 DIAGNOSIS — N80109 Endometriosis of ovary, unspecified side, unspecified depth: Secondary | ICD-10-CM | POA: Insufficient documentation

## 2015-08-21 DIAGNOSIS — G8929 Other chronic pain: Secondary | ICD-10-CM | POA: Insufficient documentation

## 2015-08-21 NOTE — Patient Instructions (Signed)
You should follow-up with Dr Donavan FoilBass in the next 1-2 months to discuss birth control/ovulation suppression with options such as the IUD or implantable device.  You should return to see us on an as needed basis.

## 2015-08-21 NOTE — Progress Notes (Signed)
POSTOPERATIVE FOLLOW-UP: ENDOMETRIOSIS RIGHT OVARY  HPI:  Meredith Perez is a 30 y.o. year old No obstetric history on file. initially seen in consultation on 07/01/15 referred by Dr Donavan FoilBass for a large sympomatic right ovarian mass.  She then underwent a right ovarian cystectomy (robotic assisted laparoscopy) with Dr Laurette SchimkeWendy Brewster on 07/16/15 without complications.  Her postoperative course was uncomplicated.  Her final pathology revealed right ovarian endometrioma.  She is seen today for a postoperative check and to discuss her pathology results and ongoing plan.  Since discharge from the hospital, she is feeling well.  She has improving appetite, normal bowel and bladder function, and pain controlled with minimal PO medication. She has no other complaints today.    Review of systems: Constitutional:  She has no weight gain or weight loss. She has no fever or chills. Eyes: No blurred vision Ears, Nose, Mouth, Throat: No dizziness, headaches or changes in hearing. No mouth sores. Cardiovascular: No chest pain, palpitations or edema. Respiratory:  No shortness of breath, wheezing or cough Gastrointestinal: She has normal bowel movements without diarrhea or constipation. She denies any nausea or vomiting. She denies blood in her stool or heart burn. Genitourinary:  She denies pelvic pain, pelvic pressure or changes in her urinary function. She has no hematuria, dysuria, or incontinence. She has no irregular vaginal bleeding or vaginal discharge Musculoskeletal: Denies muscle weakness or joint pains.  Skin:  She has no skin changes, rashes or itching Neurological:  Denies dizziness or headaches. No neuropathy, no numbness or tingling. Psychiatric:  She denies depression or anxiety. Hematologic/Lymphatic:   No easy bruising or bleeding   Physical Exam: Blood pressure 108/65, pulse 82, temperature 98.4 F (36.9 C), temperature source Oral, resp. rate 18, height 5\' 3"  (1.6 m), weight 92 lb (41.731  kg), SpO2 100 %. General: Well dressed, well nourished in no apparent distress.   HEENT:  Normocephalic and atraumatic, no lesions.  Extraocular muscles intact. Sclerae anicteric. Pupils equal, round, reactive. No mouth sores or ulcers. Thyroid is normal size, not nodular, midline. Abdomen:  Soft, nontender, nondistended.  No palpable masses.  No hepatosplenomegaly.  No ascites. Normal bowel sounds.  No hernias.  Incisions are well healed  Genitourinary: Normal EGBUS  Vaginal cuff intact.  No bleeding or discharge.  No cul de sac fullness. Extremities: No cyanosis, clubbing or edema.  No calf tenderness or erythema. No palpable cords. Psychiatric: Mood and affect are appropriate. Neurological: Awake, alert and oriented x 3. Sensation is intact, no neuropathy.  Musculoskeletal: No pain, normal strength and range of motion.  Assessment:    30 y.o. year old with malnutrition, endometriosis and chronic pain and substance abuse history.   S/p robotic assisted right ovarian cystectomy on 07/16/15.   Plan: 1) Pathology reports reviewed today 2) Treatment counseling - I discussed the nature of endometrosis. We recommend ovulation suppression (when not trying to conceive) to mitigate and control this disease. I recommended OCP's, nuvaring, mirena IUD or implantable progestin releasing device. She is most interested in the latter two. She will follow-up with Dr Donavan FoilBass to discuss further. She was given the opportunity to ask questions, which were answered to her satisfaction, and she is agreement with the above mentioned plan of care.  3)  Return to clinic on a prn basis only. We are unable to prescribe additional narcotics or sedatives given that she has resolved her postoperative healing phase.  Quinn Axeossi, Tyresse Jayson Caroline, MD

## 2015-10-17 ENCOUNTER — Telehealth: Payer: Self-pay

## 2015-10-17 NOTE — Telephone Encounter (Signed)
Incoming call from [facility] : [phone] , spoke with Ree KidaJack pharmacist , patient called requesting refill on lorazepam. Update Melissa Cross, APNP, refill request denied as we are unable to prescribe additional narcotics or sedatives given that she has resolved her postoperative healing phase , per Dr Kara MeadEmma Rossi's note on August 21, 2015. Ree KidaJack the pharmacist was updated with denial , Ree KidaJack states he will contact the patient with denial.

## 2017-02-24 ENCOUNTER — Telehealth: Payer: Self-pay | Admitting: *Deleted

## 2017-02-24 NOTE — Telephone Encounter (Signed)
[facility]

## 2022-04-23 ENCOUNTER — Encounter: Payer: Self-pay | Admitting: Nurse Practitioner

## 2022-05-14 ENCOUNTER — Ambulatory Visit: Payer: Medicaid Other | Admitting: Nurse Practitioner

## 2022-05-14 NOTE — Progress Notes (Deleted)
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   She presents to our office today   Past Medical History:  Diagnosis Date   Acute cystitis    Depression    Dysuria    Family history of adverse reaction to anesthesia    mother has bad PONV   GERD (gastroesophageal reflux disease)    OTC meds used   H/O seasonal allergies    HGSIL (high grade squamous intraepithelial dysplasia)    Motion sickness    Really prone to nausea   Panic disorder    Skull fracture ([place])    age 24- no problems now.   Substance dependence ([place])    Past Surgical History:  Procedure Laterality Date   ROBOTIC ASSISTED SALPINGO OOPHERECTOMY Right 07/16/2015   Procedure: ROBOTIC ASSISTED RIGHT cystectomy lysis of adhesions;  Surgeon: Janie Morning, MD;  Location: [facility];  Service: Gynecology;  Laterality: Right;   Social History:  Family History:    reports that she has been smoking cigarettes. She has a 5.00 pack-year smoking history. She has never used smokeless tobacco. She reports current alcohol use. She reports that she does not use drugs. family history includes Breast cancer in her paternal grandmother; Colon cancer in an other family member; Heart disease in her maternal grandfather.  Allergies  Allergen Reactions   Benadryl [Diphenhydramine Hcl] Rash    Avoids-"makes throat feels funny, rash"   Sulfa Antibiotics Rash      Outpatient Encounter Medications as of 05/14/2022  Medication Sig   acetaminophen (TYLENOL) 325 MG tablet Take 650 mg by mouth every 6 (six) hours as needed (For pain.).   buPROPion (WELLBUTRIN XL) 150 MG 24 hr tablet Take 150 mg by mouth every morning.    busPIRone (BUSPAR) 5 MG tablet  Take 5 mg by mouth 3 (three) times daily.   ibuprofen (ADVIL,MOTRIN) 200 MG tablet Take 800 mg by mouth every 6 (six) hours as needed (For pain.).   mirtazapine (REMERON) 30 MG tablet Take 30 mg by mouth at bedtime.   No facility-administered encounter medications on file as of 05/14/2022.     REVIEW OF SYSTEMS:  Gen: Denies fever, sweats or chills. No weight loss.  CV: Denies chest pain, palpitations or edema. Resp: Denies cough, shortness of breath of hemoptysis.  GI: Denies heartburn, dysphagia, stomach or lower abdominal pain. No diarrhea or constipation.  GU : Denies urinary burning, blood in urine, increased urinary frequency or incontinence. MS: Denies joint pain, muscles aches or weakness. Derm: Denies rash, itchiness, skin lesions or unhealing ulcers. Psych: Denies depression, anxiety, memory loss or confusion. Heme: Denies bruising, easy bleeding. Neuro:  Denies headaches, dizziness or paresthesias. Endo:  Denies any problems with DM, thyroid or adrenal function.  PHYSICAL EXAM: There were no vitals taken for this visit. General: in no acute distress. Head: Normocephalic and atraumatic. Eyes:  Sclerae non-icteric, conjunctive pink. Ears: Normal auditory acuity. Mouth: Dentition intact. No ulcers or lesions.  Neck: Supple, no lymphadenopathy or thyromegaly.  Lungs: Clear bilaterally to auscultation without wheezes, crackles or rhonchi. Heart: Regular rate and rhythm. No murmur, rub or gallop appreciated.  Abdomen: Soft, nontender, nondistended. No masses. No hepatosplenomegaly. Normoactive bowel sounds x 4 quadrants.  Rectal:  Musculoskeletal: Symmetrical  with no gross deformities. Skin: Warm and dry. No rash or lesions on visible extremities. Extremities: No edema. Neurological: Alert oriented x 4, no focal deficits.  Psychological:  Alert and cooperative. Normal mood and affect.  ASSESSMENT AND PLAN:    CC:  Dough, Jaymes Graff, MD

## 2022-06-23 ENCOUNTER — Encounter: Payer: Self-pay | Admitting: Nurse Practitioner

## 2022-06-23 ENCOUNTER — Ambulatory Visit: Payer: Medicaid Other | Admitting: Nurse Practitioner

## 2022-06-23 VITALS — BP 100/60 | HR 80 | Ht 63.0 in | Wt 109.0 lb

## 2022-06-23 DIAGNOSIS — R110 Nausea: Secondary | ICD-10-CM

## 2022-06-23 MED ORDER — FAMOTIDINE 20 MG PO TABS: 20.0000 mg | ORAL_TABLET | Freq: Every day | ORAL | 2 refills | Status: DC

## 2022-06-23 MED ORDER — ONDANSETRON HCL 4 MG PO TABS: 4.0000 mg | ORAL_TABLET | Freq: Three times a day (TID) | ORAL | 0 refills | Status: DC | PRN

## 2022-06-23 NOTE — Patient Instructions (Signed)
We have sent the following medications to your pharmacy for you to pick up at your convenience: Zofran & Famotidine  No alcohol or drug use.  Reduce caffeine intake  Follow up with Doctor armbruster in 2-3 months.  Thank you for trusting me with your gastrointestinal care!   Alcide Evener, CRNP

## 2022-06-23 NOTE — Progress Notes (Signed)
06/23/2022 Meredith Perez [id number] 1985-12-26   CHIEF COMPLAINT: Nausea  HISTORY OF PRESENT ILLNESS: Meredith Perez is a 37 year old female with a past medical history of depression, substance abuse, seizures 3/19 and 06/08/2022, endometriosis, and GERD. Past laparoscopy and right ovarian cystectomy. She presents to our office today as referred by Dr. Dina Perez for further evaluation regarding nausea which has occurred on and off for the past year. She is accompanied by her mother. She has nausea most days and self induces vomiting 2 to 3 times weekly for symptoms relief. She does not vomit spontaneously. Emesis is nonbloody. She had heartburn around Jan 2024 which abated. No dysphagia. She also had lower abdominal pain Jan 2024 which abated. She endorses losing 15 lbs over the past 1 to 2 months (weight 113lbs on 04/30/2022, today's weight 109lbs). She underwent an abdominal ultrasound 04/08/2022 which she reported was normal, the result is not accessible in care everywhere at this time. She is currently taking Clindamycin for a right oral ulcer and dental infection. Her nausea has not worsened since taking the antibiotic. She smokes marijuana once or twice weekly for years which she stopped 2 weeks ago.  Last cocaine use was 1 week ago.  She last took Adderall 5 days ago.  She drinks 2 red bull caffeinated beverages daily.  No alcohol use since 09/2019. She denies ever undergoing rehab habilitation for her polysubstance abuse.  She intermittently takes Ibuprofen 200 mg 2 tabs every 6 hours for 2-3 several days weekly for generalized aches/pains. No Ibuprofen for the past 2 weeks.  She reported having new onset seizure activity on 3/19 and 06/08/2022.  She was recently seen by her primary care physician who referred her to neurology for further workup.  Head CT was done 06/12/2022, results are not available in care everywhere at this time.  She denies exhibiting any further seizure activity since  06/08/2022.  Labs 06/08/2022: BBC 7.10.  Hemoglobin 16.8.  Hematocrit 47.3.  Platelet 265.  Sodium 138.  Potassium 3.4.  BUN 7.  Creatinine 0.79.  Albumin 4.5.  Total protein 7.4.  Total bili 0.5.  Alk phos 88.  AST 22.  ALT 15.  TSH 1.697.  Sed rate 15.  Urine drug screen positive for amphetamines, cocaine and THC.  Labs 03/30/2022: Lipase 10.  Amylase 42.  Normal CBC and CMP.  Abdominal sonogram 04/08/2022: Results not accessible at this time      Latest Ref Rng & Units 07/10/2015   11:50 AM  CBC  WBC 4.0 - 10.5 K/uL 9.9   Hemoglobin 12.0 - 15.0 g/dL 40.9   Hematocrit 81.1 - 46.0 % 42.1   Platelets 150 - 400 K/uL 214      Past Medical History:  Diagnosis Date   Acute cystitis    Depression    Dysuria    Family history of adverse reaction to anesthesia    mother has bad PONV   GERD (gastroesophageal reflux disease)    OTC meds used   H/O seasonal allergies    HGSIL (high grade squamous intraepithelial dysplasia)    Motion sickness    Really prone to nausea   Panic disorder    Skull fracture ([facility])    age 18- no problems now.   Substance dependence ([facility])    Past Surgical History:  Procedure Laterality Date   ROBOTIC ASSISTED SALPINGO OOPHERECTOMY Right 07/16/2015   Procedure: ROBOTIC ASSISTED RIGHT cystectomy lysis of adhesions;  Surgeon: Laurette Schimke, MD;  Location: [facility];  Service: Gynecology;  Laterality: Right;   Social History: Single. She has one son age 48. She smokes cigarettes 1ppd since x age 49. She uses cocaine, last used cocaine 1 weeks.  She smokes marijuana to 3 times weekly.  Prior binge alcohol intake, no alcohol since 09/2019.  Family History: Paternal grandmother had breast cancer. Maternal grandmother had lung cancer and throat cancer. Heart disease maternal grandfather and paternal grandmother.    Allergies  Allergen Reactions   Benadryl [Diphenhydramine Hcl] Rash    Avoids-"makes throat feels funny, rash"   Sulfa Antibiotics Rash      Outpatient  Encounter Medications as of 06/23/2022  Medication Sig   acetaminophen (TYLENOL) 325 MG tablet Take 650 mg by mouth every 6 (six) hours as needed (For pain.).   buPROPion (WELLBUTRIN XL) 150 MG 24 hr tablet Take 150 mg by mouth every morning.    busPIRone (BUSPAR) 5 MG tablet Take 5 mg by mouth 3 (three) times daily.   ibuprofen (ADVIL,MOTRIN) 200 MG tablet Take 800 mg by mouth every 6 (six) hours as needed (For pain.).   mirtazapine (REMERON) 30 MG tablet Take 30 mg by mouth at bedtime.   No facility-administered encounter medications on file as of 06/23/2022.   REVIEW OF SYSTEMS:  Gen: See HPI. No fevers.  CV: Denies chest pain, palpitations or edema. Resp: Denies cough, shortness of breath of hemoptysis.  GI: See HPI. No recent GERD symptoms. GU : Denies urinary burning, blood in urine, increased urinary frequency or incontinence. MS: Denies joint pain, muscles aches or weakness. Derm: Denies rash, itchiness, skin lesions or unhealing ulcers. Psych: + Anxiety and depression.  Heme: Denies bruising, easy bleeding. Neuro:  Denies headaches, dizziness or paresthesias. Endo:  Denies any problems with DM, thyroid or adrenal function.  PHYSICAL EXAM: BP 100/60   Pulse 80   Ht  (1.6 m)   Wt 109 lb (49.4 kg)   SpO2 97%   BMI 19.31 kg/m   Weight 113 lbs on 04/30/2022   General: 37 year old female in no acute distress. Head: Normocephalic and atraumatic. Eyes:  Sclerae non-icteric, conjunctive pink. Ears: Normal auditory acuity. Mouth: Dentition intact. No ulcers or lesions.  Neck: Supple, no lymphadenopathy or thyromegaly.  Lungs: Clear bilaterally to auscultation without wheezes, crackles or rhonchi. Heart: Regular rate and rhythm. No murmur, rub or gallop appreciated.  Abdomen: Soft, nontender, nondistended. No masses. No hepatosplenomegaly. Normoactive bowel sounds x 4 quadrants.  Rectal: Deferred. Musculoskeletal: Symmetrical with no gross deformities. Skin: Warm and dry.  No rash or lesions on visible extremities. Extremities: No edema. Neurological: Alert oriented x 4, no focal deficits.  Psychological:  Alert and cooperative. Normal mood and affect.  ASSESSMENT AND PLAN:  38 year old female with chronic nausea without spontaneous vomiting (patient self induces vomiting 2-3 times weekly for symptom relief). Abdominal sono 03/2022 reported as normal, result not available at this time.  -Patient counseled to remain alcohol/drug free.  I explained chronic marijuana use may result in cyclic nausea/vomiting syndrome. -Endoscopic evaluation deferred at this time due to ongoing neurological workup for seizures -Famotidine 20 mg 1 p.o. nightly -Reduce red bull/caffeine intake -Avoid Ibuprofen and other NSAIDS. Ok to take Tylenol  two tabs bid PRN. -Ondansetron 4 mg ODT dissolve 1 tab on tongue every 8 hours. -Recommended 3-4 small snacks sized healthy meals daily -Patient to contact office if symptoms worsen -CTAP with symptoms worsen  -Patient to follow-up with Dr. Adela Lank in 2 to 3 months  Polysubstance abuse -Patient counseled to remain abstinent from all drugs including amphetamines, cocaine and marijuana and to remain abstinent from alcohol -Substance abuse rehab encouraged  Anxiety and depression on Zyprexa -Continue follow-up with PCP -Recommend consult with psychiatry (per PCP notes, patient previously deferred therapist referral)  Oral ulcer/dental infection on Clindamycin. No diarrhea.   CC:  Dough, Doris Cheadle, MD

## 2022-06-23 NOTE — Progress Notes (Signed)
Agree with assessment and plan as outlined.  

## 2022-07-21 ENCOUNTER — Other Ambulatory Visit: Payer: Self-pay | Admitting: Nurse Practitioner

## 2022-07-21 NOTE — Telephone Encounter (Signed)
Please advise 

## 2022-07-22 ENCOUNTER — Telehealth: Payer: Self-pay | Admitting: Nurse Practitioner

## 2022-07-22 MED ORDER — FAMOTIDINE 20 MG PO TABS: 20.0000 mg | ORAL_TABLET | Freq: Every day | ORAL | 2 refills | Status: DC

## 2022-07-22 NOTE — Addendum Note (Signed)
Addended by: Rise Paganini on: 07/22/2022 02:07 PM   Modules accepted: Orders

## 2022-07-22 NOTE — Telephone Encounter (Signed)
Colleen, please advise. 

## 2022-07-22 NOTE — Telephone Encounter (Signed)
Patient called requesting a 90 day supply for her Pepcid and Zofran medication be sent over to her pharmacy. Requesting a call back once this has been approved. Please advise, thank you.

## 2022-08-13 ENCOUNTER — Other Ambulatory Visit: Payer: Self-pay | Admitting: Nurse Practitioner

## 2022-09-25 ENCOUNTER — Ambulatory Visit: Payer: Medicaid Other | Admitting: Gastroenterology

## 2022-10-07 ENCOUNTER — Other Ambulatory Visit: Payer: Self-pay | Admitting: Nurse Practitioner

## 2022-11-11 ENCOUNTER — Other Ambulatory Visit: Payer: Self-pay | Admitting: Nurse Practitioner

## 2023-06-14 ENCOUNTER — Other Ambulatory Visit: Payer: Self-pay | Admitting: Nurse Practitioner

## 2024-03-05 ENCOUNTER — Encounter (HOSPITAL_BASED_OUTPATIENT_CLINIC_OR_DEPARTMENT_OTHER): Payer: Self-pay | Admitting: Emergency Medicine

## 2024-03-05 ENCOUNTER — Ambulatory Visit (HOSPITAL_BASED_OUTPATIENT_CLINIC_OR_DEPARTMENT_OTHER)
Admission: EM | Admit: 2024-03-05 | Discharge: 2024-03-05 | Disposition: A | Discharge status: Home / Self Care | Attending: Family Medicine | Admitting: Family Medicine

## 2024-03-05 DIAGNOSIS — J069 Acute upper respiratory infection, unspecified: Secondary | ICD-10-CM

## 2024-03-05 DIAGNOSIS — J3489 Other specified disorders of nose and nasal sinuses: Secondary | ICD-10-CM

## 2024-03-05 MED ORDER — PROMETHAZINE-DM 6.25-15 MG/5ML PO SYRP: 5.0000 mL | ORAL_SOLUTION | Freq: Four times a day (QID) | ORAL | 0 refills | Status: AC | PRN

## 2024-03-05 MED ORDER — PREDNISONE 20 MG PO TABS: 20.0000 mg | ORAL_TABLET | Freq: Every day | ORAL | 0 refills | Status: AC

## 2024-03-05 NOTE — ED Triage Notes (Signed)
 Pt c/o coughing, nasal congestion started on Tuesday 12/23. Pt has taken Mucinex

## 2024-03-05 NOTE — ED Provider Notes (Signed)
 " [facility]    CSN: [id number] Arrival date & time: 03/05/24  1328      History   Chief Complaint Chief Complaint  Patient presents with   Cough   Nasal Congestion    HPI Meredith Perez is a 38 y.o. female.   38 year old female who reports cough, nasal congestion and sinus pressure that started on 02/29/2024 or earlier.  She has taken Mucinex with some relief of symptoms but she still has a lot of sinus congestion.  She is [place] out of the country next [place] and wants to be sure that she is safe to fly and not contagious.   Cough Associated symptoms: rhinorrhea   Associated symptoms: no chest pain, no chills, no ear pain, no fever, no rash, no shortness of breath and no sore throat     Past Medical History:  Diagnosis Date   Acute cystitis    Depression    Dysuria    Family history of adverse reaction to anesthesia    mother has bad PONV   GERD (gastroesophageal reflux disease)    OTC meds used   H/O seasonal allergies    HGSIL (high grade squamous intraepithelial dysplasia)    Motion sickness    Really prone to nausea   Panic disorder    Skull fracture ([facility])    age 22- no problems now.   Substance dependence [facility])     Patient Active Problem List   Diagnosis Date Noted   Endometriosis of ovary 08/21/2015   Protein-calorie malnutrition, severe 07/01/2015   Elevated CA-125 07/01/2015   Pain in joint, pelvic region and thigh 07/01/2015   History of narcotic addiction ([facility]) 07/01/2015   Nausea vomiting and diarrhea 07/01/2015    Past Surgical History:  Procedure Laterality Date   ROBOTIC ASSISTED SALPINGO OOPHERECTOMY Right 07/16/2015   Procedure: ROBOTIC ASSISTED RIGHT cystectomy lysis of adhesions;  Surgeon: Sari Bachelor, MD;  Location: [facility];  Service: Gynecology;  Laterality: Right;    OB History   No obstetric history on file.      Home Medications    Prior to Admission medications  Medication Sig Start Date End Date Taking?  Authorizing Provider  famotidine  (PEPCID ) 20 MG tablet TAKE 1 TABLET BY MOUTH EVERYDAY AT BEDTIME 06/15/23  Yes Kennedy-Smith, Colleen M, NP  predniSONE  (DELTASONE ) 20 MG tablet Take 1 tablet (20 mg total) by mouth daily with breakfast for 5 days. 03/05/24 03/10/24 Yes Ival Domino, FNP  promethazine -dextromethorphan (PROMETHAZINE -DM) 6.25-15 MG/5ML syrup Take 5 mLs by mouth 4 (four) times daily as needed for cough. Do not use and drive - May make drowsy. 03/05/24  Yes Ival Domino, FNP    Family History Family History  Problem Relation Age of Onset   Breast cancer Paternal Grandmother    Colon cancer Other    Heart disease Maternal Grandfather     Social History Social History[1]   Allergies   Benadryl [diphenhydramine hcl] and Sulfa antibiotics   Review of Systems Review of Systems  Constitutional:  Negative for chills and fever.  HENT:  Positive for congestion, postnasal drip, rhinorrhea, sinus pressure and sinus pain. Negative for ear pain and sore throat.   Eyes:  Negative for pain and visual disturbance.  Respiratory:  Positive for cough. Negative for shortness of breath.   Cardiovascular:  Negative for chest pain and palpitations.  Gastrointestinal:  Negative for abdominal pain, constipation, diarrhea, nausea and vomiting.  Genitourinary:  Negative for dysuria and hematuria.  Musculoskeletal:  Negative for arthralgias and back pain.  Skin:  Negative for color change and rash.  Neurological:  Negative for seizures and syncope.  All other systems reviewed and are negative.    Physical Exam Triage Vital Signs ED Triage Vitals  Encounter Vitals Group     BP 03/05/24 1457 101/71     Girls Systolic BP Percentile --      Girls Diastolic BP Percentile --      Boys Systolic BP Percentile --      Boys Diastolic BP Percentile --      Pulse Rate 03/05/24 1457 71     Resp 03/05/24 1457 18     Temp 03/05/24 1457 98.1 F (36.7 C)     Temp Source 03/05/24 1457 Oral     SpO2  03/05/24 1457 96 %     Weight --      Height --      Head Circumference --      Peak Flow --      Pain Score 03/05/24 1455 0     Pain Loc --      Pain Education --      Exclude from Growth Chart --    No data found.  Updated Vital Signs BP 101/71 (BP Location: Right Arm)   Pulse 71   Temp 98.1 F (36.7 C) (Oral)   Resp 18   SpO2 96%   Visual Acuity Right Eye Distance:   Left Eye Distance:   Bilateral Distance:    Right Eye Near:   Left Eye Near:    Bilateral Near:     Physical Exam Vitals and nursing note reviewed.  Constitutional:      General: She is not in acute distress.    Appearance: She is well-developed. She is not ill-appearing, toxic-appearing or diaphoretic.  HENT:     Head: Normocephalic and atraumatic.     Right Ear: Hearing, tympanic membrane, ear canal and external ear normal.     Left Ear: Hearing, tympanic membrane, ear canal and external ear normal.     Nose: Congestion and rhinorrhea present. Rhinorrhea is clear.     Right Sinus: Maxillary sinus tenderness and frontal sinus tenderness present.     Left Sinus: Maxillary sinus tenderness and frontal sinus tenderness present.     Comments: Mild sinus pressure    Mouth/Throat:     Lips: Pink.     Mouth: Mucous membranes are moist.     Pharynx: Uvula midline. No oropharyngeal exudate or posterior oropharyngeal erythema.     Tonsils: No tonsillar exudate.  Eyes:     Conjunctiva/sclera: Conjunctivae normal.     Pupils: Pupils are equal, round, and reactive to light.  Cardiovascular:     Rate and Rhythm: Normal rate and regular rhythm.     Heart sounds: S1 normal and S2 normal. No murmur heard. Pulmonary:     Effort: Pulmonary effort is normal. No respiratory distress.     Breath sounds: Normal breath sounds. No decreased breath sounds, wheezing, rhonchi or rales.  Abdominal:     General: Bowel sounds are normal.     Palpations: Abdomen is soft.     Tenderness: There is no abdominal tenderness.   Musculoskeletal:        General: No swelling.     Cervical back: Neck supple.  Lymphadenopathy:     Head:     Right side of head: No submental, submandibular, tonsillar, preauricular or posterior auricular adenopathy.     Left  side of head: No submental, submandibular, tonsillar, preauricular or posterior auricular adenopathy.     Cervical: Cervical adenopathy present.     Right cervical: Superficial cervical adenopathy present.     Left cervical: Superficial cervical adenopathy present.  Skin:    General: Skin is warm and dry.     Capillary Refill: Capillary refill takes less than 2 seconds.     Findings: No rash.  Neurological:     Mental Status: She is alert and oriented to person, place, and time.  Psychiatric:        Mood and Affect: Mood normal.      UC Treatments / Results  Labs (all labs ordered are listed, but only abnormal results are displayed) Labs Reviewed - No data to display  EKG   Radiology No results found.  Procedures Procedures (including critical care time)  Medications Ordered in UC Medications - No data to display  Initial Impression / Assessment and Plan / UC Course  I have reviewed the triage vital signs and the nursing notes.  Pertinent labs & imaging results that were available during my care of the patient were reviewed by me and considered in my medical decision making (see chart for details).  Plan of Care (see discharge instructions for additional patient precautions and education): Viral upper respiratory infection versus early sinusitis with sinus pain: Promethazine  DM, 5 mL, every 6 hours if needed for cough.  Prednisone  20 mg daily for 5 days.  Start the prednisone  in the morning.  Encouraged sinus rinses twice daily to help get the sinuses cleared.  Get plenty of fluids and rest.  We discussed that although I think she has some early sinus pressure that could even be early sinusitis, it is most likely viral not bacterial and  antibiotics are not needed at this point.  If symptoms persist or worsen we could reevaluate the need for antibiotics at that time.  Follow-up if symptoms do not improve, worsen or new symptoms occur.  I reviewed the plan of care with the patient and/or the patient's guardian.  The patient and/or guardian had time to ask questions and acknowledged that the questions were answered.  Final Clinical Impressions(s) / UC Diagnoses   Final diagnoses:  Sinus pain  Viral URI     Discharge Instructions      Viral upper respiratory infection versus early sinusitis with sinus pain: Promethazine  DM, 5 mL, every 6 hours if needed for cough.  Prednisone  20 mg daily for 5 days.  Start the prednisone  in the morning.  Encouraged sinus rinses twice daily to help get the sinuses cleared.  Get plenty of fluids and rest.  Follow-up if symptoms do not improve, worsen or new symptoms occur.     ED Prescriptions     Medication Sig Dispense Auth. Provider   promethazine -dextromethorphan (PROMETHAZINE -DM) 6.25-15 MG/5ML syrup Take 5 mLs by mouth 4 (four) times daily as needed for cough. Do not use and drive - May make drowsy. 118 mL Ival Domino, FNP   predniSONE  (DELTASONE ) 20 MG tablet Take 1 tablet (20 mg total) by mouth daily with breakfast for 5 days. 5 tablet Blue Ruggerio, FNP      PDMP not reviewed this encounter.    [1]  Social History Tobacco Use   Smoking status: Every Day    Current packs/day: 0.50    Average packs/day: 0.5 packs/day for 10.0 years (5.0 ttl pk-yrs)    Types: Cigarettes   Smokeless tobacco: Never  Substance Use Topics  Alcohol use: Yes   Drug use: No    Types: Cocaine, Barbituates    Comment: Quit-None in 5 yrs, Xanax- none since     Ival Domino, [place] 03/05/24 1750  "

## 2024-03-05 NOTE — Discharge Instructions (Addendum)
 Viral upper respiratory infection versus early sinusitis with sinus pain: Promethazine  DM, 5 mL, every 6 hours if needed for cough.  Prednisone  20 mg daily for 5 days.  Start the prednisone  in the morning.  Encouraged sinus rinses twice daily to help get the sinuses cleared.  Get plenty of fluids and rest.  Follow-up if symptoms do not improve, worsen or new symptoms occur.
